# Patient Record
Sex: Female | Born: 1970 | Race: White | Hispanic: No | Marital: Married | Smoking: Never smoker
Health system: Southern US, Community
[De-identification: ages and names within clinical notes are randomized; demographics above are authoritative.]

## PROBLEM LIST (undated history)

## (undated) DIAGNOSIS — E785 Hyperlipidemia, unspecified: Secondary | ICD-10-CM

## (undated) DIAGNOSIS — I10 Essential (primary) hypertension: Secondary | ICD-10-CM

## (undated) DIAGNOSIS — D649 Anemia, unspecified: Secondary | ICD-10-CM

## (undated) DIAGNOSIS — K219 Gastro-esophageal reflux disease without esophagitis: Secondary | ICD-10-CM

## (undated) HISTORY — PX: BREAST SURGERY: SHX581

## (undated) HISTORY — DX: Gastro-esophageal reflux disease without esophagitis: K21.9

## (undated) HISTORY — DX: Essential (primary) hypertension: I10

## (undated) HISTORY — DX: Anemia, unspecified: D64.9

## (undated) HISTORY — DX: Hyperlipidemia, unspecified: E78.5

---

## 2009-12-02 ENCOUNTER — Encounter
Admission: RE | Admit: 2009-12-02 | Discharge: 2009-12-29 | Payer: Self-pay | Source: Home / Self Care | Admitting: Unknown Physician Specialty

## 2013-03-27 ENCOUNTER — Ambulatory Visit (HOSPITAL_COMMUNITY): Payer: Self-pay

## 2017-05-07 DIAGNOSIS — R638 Other symptoms and signs concerning food and fluid intake: Secondary | ICD-10-CM | POA: Diagnosis not present

## 2017-05-07 DIAGNOSIS — K219 Gastro-esophageal reflux disease without esophagitis: Secondary | ICD-10-CM | POA: Diagnosis not present

## 2017-05-07 DIAGNOSIS — R002 Palpitations: Secondary | ICD-10-CM | POA: Diagnosis not present

## 2017-05-08 DIAGNOSIS — D509 Iron deficiency anemia, unspecified: Secondary | ICD-10-CM | POA: Diagnosis not present

## 2017-05-08 DIAGNOSIS — D473 Essential (hemorrhagic) thrombocythemia: Secondary | ICD-10-CM | POA: Diagnosis not present

## 2017-05-08 DIAGNOSIS — R002 Palpitations: Secondary | ICD-10-CM | POA: Diagnosis not present

## 2017-05-08 DIAGNOSIS — Z0000 Encounter for general adult medical examination without abnormal findings: Secondary | ICD-10-CM | POA: Diagnosis not present

## 2017-05-08 DIAGNOSIS — R7309 Other abnormal glucose: Secondary | ICD-10-CM | POA: Diagnosis not present

## 2017-05-08 DIAGNOSIS — Z1389 Encounter for screening for other disorder: Secondary | ICD-10-CM | POA: Diagnosis not present

## 2017-05-16 DIAGNOSIS — D509 Iron deficiency anemia, unspecified: Secondary | ICD-10-CM | POA: Diagnosis not present

## 2017-05-29 DIAGNOSIS — I499 Cardiac arrhythmia, unspecified: Secondary | ICD-10-CM | POA: Insufficient documentation

## 2017-05-29 DIAGNOSIS — R079 Chest pain, unspecified: Secondary | ICD-10-CM | POA: Insufficient documentation

## 2017-05-29 DIAGNOSIS — K219 Gastro-esophageal reflux disease without esophagitis: Secondary | ICD-10-CM | POA: Insufficient documentation

## 2017-05-29 DIAGNOSIS — R42 Dizziness and giddiness: Secondary | ICD-10-CM

## 2017-05-29 DIAGNOSIS — E663 Overweight: Secondary | ICD-10-CM

## 2017-05-30 ENCOUNTER — Encounter: Payer: Self-pay | Admitting: Cardiovascular Disease

## 2017-05-30 ENCOUNTER — Ambulatory Visit (INDEPENDENT_AMBULATORY_CARE_PROVIDER_SITE_OTHER): Payer: 59 | Admitting: Cardiovascular Disease

## 2017-05-30 ENCOUNTER — Other Ambulatory Visit (HOSPITAL_COMMUNITY)
Admission: RE | Admit: 2017-05-30 | Discharge: 2017-05-30 | Disposition: A | Discharge status: Home / Self Care | Payer: 59 | Source: Ambulatory Visit | Attending: Cardiovascular Disease | Admitting: Cardiovascular Disease

## 2017-05-30 VITALS — BP 134/88 | HR 111 | Ht 65.0 in | Wt 154.0 lb

## 2017-05-30 DIAGNOSIS — I10 Essential (primary) hypertension: Secondary | ICD-10-CM

## 2017-05-30 DIAGNOSIS — R002 Palpitations: Secondary | ICD-10-CM

## 2017-05-30 DIAGNOSIS — E7800 Pure hypercholesterolemia, unspecified: Secondary | ICD-10-CM

## 2017-05-30 DIAGNOSIS — M2550 Pain in unspecified joint: Secondary | ICD-10-CM

## 2017-05-30 DIAGNOSIS — R000 Tachycardia, unspecified: Secondary | ICD-10-CM

## 2017-05-30 DIAGNOSIS — D509 Iron deficiency anemia, unspecified: Secondary | ICD-10-CM

## 2017-05-30 LAB — SEDIMENTATION RATE: SED RATE: 20 mm/h (ref 0–22)

## 2017-05-30 MED ORDER — METOPROLOL TARTRATE 25 MG PO TABS: 25.0000 mg | ORAL_TABLET | Freq: Two times a day (BID) | ORAL | 3 refills | Status: DC

## 2017-05-30 NOTE — Progress Notes (Signed)
CARDIOLOGY CONSULT NOTE  Patient ID: Megan Pruitt MRN: [id number] DOB/AGE: 1971/08/11 46 y.o.  Admit date: (Not on file) Primary Physician: [place], [place] Referring Physician: Dr. Hilma Favors  Reason for Consultation: palpitations  HPI: Megan Pruitt is a 46 y.o. female who is being seen today for the evaluation of palpitations at the request of Sharilyn Sites, MD.   Past medical history includes hypertension, hyperlipidemia and iron deficiency anemia.  She has been experiencing palpitations for the past one year but had one episode where she felt jittery. She had blood work done at her PCPs office and was found to have iron deficiency anemia. She has been on ferrous sulfate for the past 2 or 3 weeks and palpitations are slowly improving.  She used to experience shortness of breath when climbing a flight of stairs. This has also been decreasing since she started ferrous sulfate.  She denies chest pain, orthopnea, paroxysmal nocturnal dyspnea, and syncope.  She does not smoke.  She has a history of preeclampsia. She has 5 children.  She had been experiencing diffuse joint pains in her shoulders, hips, and hands. She has gone on a gluten-free diet and these symptoms have improved.  ECG performed in the office today which I ordered and personally interpreted demonstrated sinus tachycardia with an isolated PVC and nonspecific ST segment and T-wave abnormalities seen diffusely. Heart rate is 112 bpm.  Recent labs were reviewed and demonstrated the following: Total cholesterol 282, HDL 53, LDL 200, triglycerides 145, HbA1c 5.4%, albumin 4.6, alkaline phosphatase 130, AST 11, ALT 14, calcium 9.8, white blood cells 8.5, hemoglobin 11.2, platelets 423, MCV 73, sodium 138, potassium 4.3, chloride 101, bicarbonate 25, BUN 15, creatinine 0.93, TSH 2.9.  She said she has had heavy menses ever since her teen years.   Family history: Father died of a myocardial  infarction at age 61. His father also died of an MI at age 25. Her father had hypertension and congestive heart failure. She has a twin sister and 2 brothers all of whom have hypertension.      No Known Allergies  Current Outpatient Prescriptions  Medication Sig Dispense Refill  . atorvastatin (LIPITOR) 80 MG tablet Take 40 mg by mouth daily.     . ferrous sulfate 325 (65 FE) MG tablet Take 325 mg by mouth every other day.    Marland Kitchen NIFEdipine (PROCARDIA-XL/ADALAT CC) 60 MG 24 hr tablet Take 60 mg by mouth daily.     No current facility-administered medications for this visit.     Past Medical History:  Diagnosis Date  . Hypertension     History reviewed. No pertinent surgical history.  Social History   Social History  . Marital status: Married    Spouse name: N/A  . Number of children: N/A  . Years of education: N/A   Occupational History  . Not on file.   Social History Main Topics  . Smoking status: Never Smoker  . Smokeless tobacco: Never Used  . Alcohol use No  . Drug use: No  . Sexual activity: Yes   Other Topics Concern  . Not on file   Social History Narrative  . No narrative on file      Current Meds  Medication Sig  . atorvastatin (LIPITOR) 80 MG tablet Take 40 mg by mouth daily.   . ferrous sulfate 325 (65 FE) MG tablet Take 325 mg by mouth every other day.  Marland Kitchen NIFEdipine (PROCARDIA-XL/ADALAT CC) 60 MG 24  hr tablet Take 60 mg by mouth daily.      Review of systems complete and found to be negative unless listed above in HPI    Physical exam Blood pressure 134/88, pulse (!) 111, height 5' 5"  (1.651 m), weight 154 lb (69.9 kg), SpO2 98 %. General: NAD Neck: No JVD, no thyromegaly or thyroid nodule.  Lungs: Clear to auscultation bilaterally with normal respiratory effort. CV: Nondisplaced PMI. Regular rate and rhythm, normal S1/S2, no S3/S4, no murmur.  No peripheral edema.  No carotid bruit.    Abdomen: Soft, nontender, no distention.  Skin:  Intact without lesions or rashes.  Neurologic: Alert and oriented x 3.  Psych: Normal affect. Extremities: No clubbing or cyanosis.  HEENT: Normal.   ECG: Most recent ECG reviewed.   Labs: No results found for: K, BUN, CREATININE, ALT, TSH, HGB   Lipids: No results found for: LDLCALC, LDLDIRECT, CHOL, TRIG, HDL      ASSESSMENT AND PLAN:  1. Palpitations/tachycardia: These been going on for one year. ECG shows sinus tachycardia with an isolated PVC is noted above. She is only mildly anemic with some symptom improvement with ferrous sulfate. Potassium and TSH are normal. Blood pressure is mildly elevated. I will start metoprolol tartrate 25 mg twice daily. I will order a 2-D echocardiogram with Doppler to evaluate cardiac structure, function, and regional wall motion.  2. Hypertension: Mildly elevated. I will monitor given the addition of metoprolol tartrate (see #1) to see if further antihypertensive titration is indicated.  3. 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 Most recent labs reviewed above. She is now on Lipitor 40 mg. These will need to be repeated within the next few months.  4. Iron deficiency anemia: Hemoglobin 11.2 as noted above. She is now on ferrous sulfate. She has had heavy menses since her teenage years. She is going to see her gynecologist.  5. Diffuse joint pain: I will check an ESR. There has been some symptom improvement with a gluten-free diet.     Disposition: Follow up in 1 month  Signed: Kate Sable, M.D., F.A.C.C.  05/30/2017, 1:13 PM

## 2017-05-30 NOTE — Patient Instructions (Signed)
Medication Instructions:  Start metoprolol 25 mg - twice daily   Labwork: Today esr  Testing/Procedures: Your physician has requested that you have an echocardiogram. Echocardiography is a painless test that uses sound waves to create images of your heart. It provides your doctor with information about the size and shape of your heart and how well your heart's chambers and valves are working. This procedure takes approximately one hour. There are no restrictions for this procedure.    Follow-Up: Your physician recommends that you schedule a follow-up appointment in: 1 month    Any Other Special Instructions Will Be Listed Below (If Applicable).     If you need a refill on your cardiac medications before your next appointment, please call your pharmacy.

## 2017-06-01 ENCOUNTER — Ambulatory Visit (HOSPITAL_COMMUNITY)
Admission: RE | Admit: 2017-06-01 | Discharge: 2017-06-01 | Disposition: A | Discharge status: Home / Self Care | Payer: 59 | Source: Ambulatory Visit | Attending: Cardiovascular Disease | Admitting: Cardiovascular Disease

## 2017-06-01 DIAGNOSIS — R000 Tachycardia, unspecified: Secondary | ICD-10-CM | POA: Insufficient documentation

## 2017-06-01 DIAGNOSIS — R002 Palpitations: Secondary | ICD-10-CM | POA: Insufficient documentation

## 2017-06-01 LAB — ECHOCARDIOGRAM COMPLETE
AVLVOTPG: 4 mmHg
CHL CUP MV DEC (S): 211
CHL CUP STROKE VOLUME: 52 mL
E/e' ratio: 4.4
EWDT: 211 ms
FS: 34 % (ref 28–44)
IV/PV OW: 1.01
LA ID, A-P, ES: 32 mm
LA diam end sys: 32 mm
LA diam index: 1.78 cm/m2
LA vol A4C: 28.9 ml
LA vol index: 22.4 mL/m2
LA vol: 40.4 mL
LV E/e' medial: 4.4
LV E/e'average: 4.4
LV TDI E'LATERAL: 16.6
LV TDI E'MEDIAL: 10.7
LV dias vol: 81 mL (ref 46–106)
LV e' LATERAL: 16.6 cm/s
LV sys vol index: 16 mL/m2
LV sys vol: 29 mL
LVDIAVOLIN: 45 mL/m2
LVOT VTI: 25.1 cm
LVOT area: 2.27 cm2
LVOT diameter: 17 mm
LVOTPV: 105 cm/s
LVOTSV: 57 mL
MV Peak grad: 2 mmHg
MV pk A vel: 73.7 m/s
MV pk E vel: 73 m/s
PW: 8.74 mm — AB (ref 0.6–1.1)
RV LATERAL S' VELOCITY: 12.4 cm/s
RV TAPSE: 23.6 mm
RV sys press: 17 mmHg
Reg peak vel: 186 cm/s
Simpson's disk: 64
TR max vel: 186 cm/s

## 2017-06-01 NOTE — Progress Notes (Signed)
*  PRELIMINARY RESULTS* Echocardiogram 2D Echocardiogram has been performed.  Megan Pruitt 06/01/2017, 11:08 AM

## 2017-06-04 ENCOUNTER — Telehealth: Payer: Self-pay | Admitting: Cardiovascular Disease

## 2017-06-04 MED ORDER — METOPROLOL TARTRATE 25 MG PO TABS: 12.5000 mg | ORAL_TABLET | Freq: Two times a day (BID) | ORAL | 3 refills | Status: DC

## 2017-06-04 NOTE — Telephone Encounter (Signed)
Pt has some questions about her metoprolol tartrate (LOPRESSOR) 25 MG tablet [[id number]]  Please give her a call @ [phone]

## 2017-06-04 NOTE — Telephone Encounter (Signed)
Pt states lopressor  She started 5 days ago,makes her tired and her fit at night says HR in 40's, is normal during day.

## 2017-06-04 NOTE — Telephone Encounter (Signed)
Reduce to 12.5mg bid. 

## 2017-06-04 NOTE — Telephone Encounter (Signed)
LM on pt's secure line to reduce dose, call back and let us know how she feels

## 2017-07-03 DIAGNOSIS — E7800 Pure hypercholesterolemia, unspecified: Secondary | ICD-10-CM | POA: Diagnosis not present

## 2017-07-03 DIAGNOSIS — D509 Iron deficiency anemia, unspecified: Secondary | ICD-10-CM | POA: Diagnosis not present

## 2017-07-05 ENCOUNTER — Ambulatory Visit (INDEPENDENT_AMBULATORY_CARE_PROVIDER_SITE_OTHER): Payer: 59 | Admitting: Cardiovascular Disease

## 2017-07-05 ENCOUNTER — Encounter: Payer: Self-pay | Admitting: Cardiovascular Disease

## 2017-07-05 VITALS — BP 106/68 | HR 78 | Ht 65.0 in | Wt 155.6 lb

## 2017-07-05 DIAGNOSIS — I10 Essential (primary) hypertension: Secondary | ICD-10-CM

## 2017-07-05 DIAGNOSIS — M2550 Pain in unspecified joint: Secondary | ICD-10-CM | POA: Diagnosis not present

## 2017-07-05 DIAGNOSIS — R002 Palpitations: Secondary | ICD-10-CM

## 2017-07-05 DIAGNOSIS — E7800 Pure hypercholesterolemia, unspecified: Secondary | ICD-10-CM

## 2017-07-05 DIAGNOSIS — R000 Tachycardia, unspecified: Secondary | ICD-10-CM

## 2017-07-05 DIAGNOSIS — D509 Iron deficiency anemia, unspecified: Secondary | ICD-10-CM

## 2017-07-05 NOTE — Progress Notes (Signed)
SUBJECTIVE: The patient presents for follow-up of palpitations.  ESR was normal.  Echocardiogram was entirely normal, LVEF 60-65%.  She is feeling much better.  Palpitations are far less frequent.  I reviewed labs performed on 07/03/17: Total cholesterol 139, HDL 51, LDL 68, triglycerides 100, white blood cell 7, hemoglobin 13, platelets 339, MCV 78.    She has a history of preeclampsia. She has 5 children.   Family history: Father died of a myocardial infarction at age 38. His father also died of an MI at age 67. Her father had hypertension and congestive heart failure. She has a twin sister and 2 brothers all of whom have hypertension.   Review of Systems: As per "subjective", otherwise negative.  No Known Allergies  Current Outpatient Medications  Medication Sig Dispense Refill  . atorvastatin (LIPITOR) 40 MG tablet Take 40 mg daily by mouth.    . ferrous sulfate 325 (65 FE) MG tablet Take 325 mg by mouth every other day.    . metoprolol tartrate (LOPRESSOR) 25 MG tablet Take 0.5 tablets (12.5 mg total) by mouth 2 (two) times daily. 90 tablet 3  . NIFEdipine (PROCARDIA-XL/ADALAT CC) 60 MG 24 hr tablet Take 60 mg by mouth daily.     No current facility-administered medications for this visit.     Past Medical History:  Diagnosis Date  . Hypertension     Past Surgical History:  Procedure Laterality Date  . BREAST SURGERY     absess     Social History   Socioeconomic History  . Marital status: Married    Spouse name: Not on file  . Number of children: Not on file  . Years of education: Not on file  . Highest education level: Not on file  Social Needs  . Financial resource strain: Not on file  . Food insecurity - worry: Not on file  . Food insecurity - inability: Not on file  . Transportation needs - medical: Not on file  . Transportation needs - non-medical: Not on file  Occupational History  . Not on file  Tobacco Use  . Smoking status: Never  Smoker  . Smokeless tobacco: Never Used  Substance and Sexual Activity  . Alcohol use: No  . Drug use: No  . Sexual activity: Yes  Other Topics Concern  . Not on file  Social History Narrative  . Not on file     Vitals:   07/05/17 0958  BP: 106/68  Pulse: 78  SpO2: 98%  Weight: 155 lb 9.6 oz (70.6 kg)  Height: 5' 5"  (1.651 m)    Wt Readings from Last 3 Encounters:  07/05/17 155 lb 9.6 oz (70.6 kg)  05/30/17 154 lb (69.9 kg)     PHYSICAL EXAM General: NAD HEENT: Normal. Neck: No JVD, no thyromegaly. Lungs: Clear to auscultation bilaterally with normal respiratory effort. CV: Regular rate and rhythm, normal S1/S2, no S3/S4, no murmur. No pretibial or periankle edema.  No carotid bruit.   Abdomen: Soft, nontender, no distention.  Neurologic: Alert and oriented.  Psych: Normal affect. Skin: Normal. Musculoskeletal: No gross deformities.    ECG: Most recent ECG reviewed.   Labs: No results found for: K, BUN, CREATININE, ALT, TSH, HGB   Lipids: No results found for: LDLCALC, LDLDIRECT, CHOL, TRIG, HDL     ASSESSMENT AND PLAN:  1. Palpitations/tachycardia:  Symptomatically improved on metoprolol tartrate 12.5 mg twice daily.  These been going on for one year. ECG showed sinus tachycardia  with an isolated PVC. She was only mildly anemic with symptom improvement with ferrous sulfate. Potassium and TSH are normal. Blood pressure is now normal. Echocardiogram was entirely normal as detailed above.  2. Hypertension: Blood pressure now controlled with the addition of low-dose metoprolol tartrate.  She is also on nifedipine 60 mg daily.  No changes to therapy.  3. Hyperlipidemia: Lipids well controlled on Lipitor 40 milligrams daily as detailed above.  No changes.  4. Iron deficiency anemia: Hemoglobin previously 11.2 and is now 13.  Continue ferrous sulfate.  5. Diffuse joint pain: ESR was normal. There has been some symptom improvement with a gluten-free  diet.     Disposition: Follow up 6 months   Kate Sable, M.D., F.A.C.C.

## 2017-07-05 NOTE — Patient Instructions (Signed)
Your physician wants you to follow-up in: 6 months with Dr.Koneswaran You will receive a reminder letter in the mail two months in advance. If you don't receive a letter, please call our office to schedule the follow-up appointment.   Your physician recommends that you continue on your current medications as directed. Please refer to the Current Medication list given to you today.    If you need a refill on your cardiac medications before your next appointment, please call your pharmacy.     No lab work or tests ordered today.     Thank you for choosing  [facility] !         

## 2018-01-17 ENCOUNTER — Ambulatory Visit: Payer: 59 | Admitting: Cardiovascular Disease

## 2018-03-07 ENCOUNTER — Encounter: Payer: Self-pay | Admitting: Cardiovascular Disease

## 2018-03-07 ENCOUNTER — Ambulatory Visit (INDEPENDENT_AMBULATORY_CARE_PROVIDER_SITE_OTHER): Payer: 59 | Admitting: Cardiovascular Disease

## 2018-03-07 ENCOUNTER — Other Ambulatory Visit (HOSPITAL_COMMUNITY)
Admission: RE | Admit: 2018-03-07 | Discharge: 2018-03-07 | Disposition: A | Discharge status: Home / Self Care | Payer: 59 | Source: Ambulatory Visit | Attending: Cardiovascular Disease | Admitting: Cardiovascular Disease

## 2018-03-07 ENCOUNTER — Other Ambulatory Visit: Payer: Self-pay | Admitting: Cardiovascular Disease

## 2018-03-07 ENCOUNTER — Ambulatory Visit (HOSPITAL_COMMUNITY)
Admission: RE | Admit: 2018-03-07 | Discharge: 2018-03-07 | Disposition: A | Discharge status: Home / Self Care | Payer: 59 | Source: Ambulatory Visit | Attending: Cardiovascular Disease | Admitting: Cardiovascular Disease

## 2018-03-07 VITALS — BP 118/68 | HR 78 | Ht 65.0 in | Wt 155.0 lb

## 2018-03-07 DIAGNOSIS — E7800 Pure hypercholesterolemia, unspecified: Secondary | ICD-10-CM

## 2018-03-07 DIAGNOSIS — I10 Essential (primary) hypertension: Secondary | ICD-10-CM

## 2018-03-07 DIAGNOSIS — A669 Yaws, unspecified: Secondary | ICD-10-CM

## 2018-03-07 DIAGNOSIS — M1711 Unilateral primary osteoarthritis, right knee: Secondary | ICD-10-CM | POA: Diagnosis not present

## 2018-03-07 DIAGNOSIS — R000 Tachycardia, unspecified: Secondary | ICD-10-CM

## 2018-03-07 DIAGNOSIS — D509 Iron deficiency anemia, unspecified: Secondary | ICD-10-CM

## 2018-03-07 DIAGNOSIS — M2550 Pain in unspecified joint: Secondary | ICD-10-CM

## 2018-03-07 DIAGNOSIS — M25461 Effusion, right knee: Secondary | ICD-10-CM | POA: Diagnosis not present

## 2018-03-07 DIAGNOSIS — R609 Edema, unspecified: Secondary | ICD-10-CM

## 2018-03-07 DIAGNOSIS — M25561 Pain in right knee: Secondary | ICD-10-CM

## 2018-03-07 DIAGNOSIS — R002 Palpitations: Secondary | ICD-10-CM | POA: Diagnosis not present

## 2018-03-07 NOTE — Patient Instructions (Signed)
Medication Instructions:  Your physician recommends that you continue on your current medications as directed. Please refer to the Current Medication list given to you today.   Labwork: RHEUMATOID FACTOR ANTI-CCP   Testing/Procedures: KNEE X-RAY   Follow-Up: Your physician wants you to follow-up in: 6 MONTHS .  You will receive a reminder letter in the mail two months in advance. If you don't receive a letter, please call our office to schedule the follow-up appointment.   Any Other Special Instructions Will Be Listed Below (If Applicable).     If you need a refill on your cardiac medications before your next appointment, please call your pharmacy.

## 2018-03-07 NOTE — Addendum Note (Signed)
Addended by: Debbora Lacrosse R on: 03/07/2018 01:38 PM   Modules accepted: Orders

## 2018-03-07 NOTE — Progress Notes (Signed)
SUBJECTIVE: The patient presents for follow-up of palpitations.  She underwent a normal echocardiogram in October 2018, LVEF 60 to 65%.  She has a history of preeclampsia.  She has 5 children.  She denies palpitations.  She has had diffuse joint pains which have apparently flared in the past month.  She did initially try to gluten-free diet and symptoms had resolved for quite some time.  She has particular difficulty with her right knee regarding pain and stiffness.  By the end of the day she is unable to raise her right leg up completely.  She has noticed right knee swelling.  She denies chest pain, shortness of breath, and leg swelling.      Review of Systems: As per "subjective", otherwise negative.  No Known Allergies  Current Outpatient Medications  Medication Sig Dispense Refill  . atorvastatin (LIPITOR) 40 MG tablet Take 40 mg daily by mouth.    . ferrous sulfate 325 (65 FE) MG tablet Take 325 mg by mouth every other day.    . metoprolol tartrate (LOPRESSOR) 25 MG tablet Take 0.5 tablets (12.5 mg total) by mouth 2 (two) times daily. 90 tablet 3  . NIFEdipine (PROCARDIA-XL/ADALAT CC) 60 MG 24 hr tablet Take 60 mg by mouth daily.     No current facility-administered medications for this visit.     Past Medical History:  Diagnosis Date  . Hypertension     Past Surgical History:  Procedure Laterality Date  . BREAST SURGERY     absess     Social History   Socioeconomic History  . Marital status: Married    Spouse name: Not on file  . Number of children: Not on file  . Years of education: Not on file  . Highest education level: Not on file  Occupational History  . Not on file  Social Needs  . Financial resource strain: Not on file  . Food insecurity:    Worry: Not on file    Inability: Not on file  . Transportation needs:    Medical: Not on file    Non-medical: Not on file  Tobacco Use  . Smoking status: Never Smoker  . Smokeless tobacco: Never Used    Substance and Sexual Activity  . Alcohol use: No  . Drug use: No  . Sexual activity: Yes  Lifestyle  . Physical activity:    Days per week: Not on file    Minutes per session: Not on file  . Stress: Not on file  Relationships  . Social connections:    Talks on phone: Not on file    Gets together: Not on file    Attends religious service: Not on file    Active member of club or organization: Not on file    Attends meetings of clubs or organizations: Not on file    Relationship status: Not on file  . Intimate partner violence:    Fear of current or ex partner: Not on file    Emotionally abused: Not on file    Physically abused: Not on file    Forced sexual activity: Not on file  Other Topics Concern  . Not on file  Social History Narrative  . Not on file     Vitals:   03/07/18 1314  BP: 118/68  Pulse: 78  SpO2: 97%  Weight: 155 lb (70.3 kg)  Height: 5\' 5"  (1.651 m)    Wt Readings from Last 3 Encounters:  03/07/18 155 lb (70.3  kg)  07/05/17 155 lb 9.6 oz (70.6 kg)  05/30/17 154 lb (69.9 kg)     PHYSICAL EXAM General: NAD HEENT: Normal. Neck: No JVD, no thyromegaly. Lungs: Clear to auscultation bilaterally with normal respiratory effort. CV: Regular rate and rhythm, normal S1/S2, no S3/S4, no murmur. No pretibial or periankle edema.  No carotid bruit.   Abdomen: Soft, nontender, no distention.  Neurologic: Alert and oriented.  Psych: Normal affect. Skin: Normal. Musculoskeletal: Mild right knee swelling.    ECG: Reviewed above under Subjective   Labs: No results found for: K, BUN, CREATININE, ALT, TSH, HGB   Lipids: No results found for: LDLCALC, LDLDIRECT, CHOL, TRIG, HDL     ASSESSMENT AND PLAN: 1.Palpitations/tachycardia: Symptomatically stable metoprolol tartrate 12.5 mg twice daily. Echocardiogram was entirely normal as detailed above.  2. Hypertension: Blood pressure is normal on low-dose metoprolol tartrate and nifedipine 60 mg daily.   No changes to therapy.  3. Hyperlipidemia: Lipids well controlled on Lipitor 40 milligrams.  No changes.  4. Iron deficiency anemia: Continue ferrous sulfate.  5.  Diffuse joint pains and right knee pain: I will obtain an x-ray to evaluate for any joint erosion.  I will also obtain rheumatoid factor and anti-CCP antibodies.    Disposition: Follow up 6 months   Kate Sable, M.D., F.A.C.C.

## 2018-03-08 LAB — CYCLIC CITRUL PEPTIDE ANTIBODY, IGG/IGA: CCP ANTIBODIES IGG/IGA: 2 U (ref 0–19)

## 2018-03-08 LAB — RHEUMATOID FACTOR

## 2018-03-25 DIAGNOSIS — E663 Overweight: Secondary | ICD-10-CM | POA: Diagnosis not present

## 2018-03-25 DIAGNOSIS — Z0000 Encounter for general adult medical examination without abnormal findings: Secondary | ICD-10-CM | POA: Diagnosis not present

## 2018-03-25 DIAGNOSIS — Z6825 Body mass index (BMI) 25.0-25.9, adult: Secondary | ICD-10-CM | POA: Diagnosis not present

## 2018-04-03 ENCOUNTER — Other Ambulatory Visit (HOSPITAL_COMMUNITY): Payer: Self-pay | Admitting: Family Medicine

## 2018-04-03 DIAGNOSIS — Z1231 Encounter for screening mammogram for malignant neoplasm of breast: Secondary | ICD-10-CM

## 2018-06-12 ENCOUNTER — Other Ambulatory Visit: Payer: Self-pay | Admitting: Cardiovascular Disease

## 2018-09-18 ENCOUNTER — Telehealth: Payer: Self-pay | Admitting: Cardiovascular Disease

## 2018-09-18 NOTE — Telephone Encounter (Signed)
Pt is needing refills sent in on her BP medication, tying to find a f/u apt for the pt. She's a Pharmacist, hospital.

## 2018-09-19 MED ORDER — METOPROLOL TARTRATE 25 MG PO TABS: 12.5000 mg | ORAL_TABLET | Freq: Two times a day (BID) | ORAL | 3 refills | Status: DC

## 2018-09-19 NOTE — Telephone Encounter (Signed)
Refill sent to [facility].

## 2018-10-14 DIAGNOSIS — Z0000 Encounter for general adult medical examination without abnormal findings: Secondary | ICD-10-CM | POA: Diagnosis not present

## 2018-10-14 DIAGNOSIS — Z6823 Body mass index (BMI) 23.0-23.9, adult: Secondary | ICD-10-CM | POA: Diagnosis not present

## 2018-10-14 DIAGNOSIS — E7849 Other hyperlipidemia: Secondary | ICD-10-CM | POA: Diagnosis not present

## 2018-10-14 DIAGNOSIS — Z1389 Encounter for screening for other disorder: Secondary | ICD-10-CM | POA: Diagnosis not present

## 2018-10-14 DIAGNOSIS — I10 Essential (primary) hypertension: Secondary | ICD-10-CM | POA: Diagnosis not present

## 2018-10-28 DIAGNOSIS — D696 Thrombocytopenia, unspecified: Secondary | ICD-10-CM | POA: Diagnosis not present

## 2018-11-01 ENCOUNTER — Ambulatory Visit (INDEPENDENT_AMBULATORY_CARE_PROVIDER_SITE_OTHER): Payer: 59 | Admitting: Cardiovascular Disease

## 2018-11-01 ENCOUNTER — Encounter: Payer: Self-pay | Admitting: Cardiovascular Disease

## 2018-11-01 VITALS — BP 123/81 | HR 67 | Ht 65.0 in | Wt 143.2 lb

## 2018-11-01 DIAGNOSIS — E7800 Pure hypercholesterolemia, unspecified: Secondary | ICD-10-CM | POA: Diagnosis not present

## 2018-11-01 DIAGNOSIS — I10 Essential (primary) hypertension: Secondary | ICD-10-CM

## 2018-11-01 DIAGNOSIS — R000 Tachycardia, unspecified: Secondary | ICD-10-CM | POA: Diagnosis not present

## 2018-11-01 DIAGNOSIS — D509 Iron deficiency anemia, unspecified: Secondary | ICD-10-CM

## 2018-11-01 DIAGNOSIS — R002 Palpitations: Secondary | ICD-10-CM

## 2018-11-01 MED ORDER — NIFEDIPINE ER 30 MG PO TB24: 30.0000 mg | ORAL_TABLET | Freq: Every day | ORAL | 3 refills | Status: DC

## 2018-11-01 NOTE — Progress Notes (Signed)
SUBJECTIVE: The patient presents for follow-up of palpitations.  She underwent a normal echocardiogram in October 2018, LVEF 60 to 65%.  She has a history of preeclampsia.  She has 5 children.  She had diffuse joint pains at her last visit with considerable knee pain.  I checked rheumatoid factor and anti-CCP antibodies and both were normal.  X-ray of the right knee showed an effusion with mild degenerative changes.  ECG performed today showed sinus rhythm with nonspecific T wave abnormalities.  The patient denies any symptoms of chest pain, palpitations, shortness of breath, lightheadedness, dizziness, leg swelling, orthopnea, PND, and syncope.  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  She currently has a child in kindergarten and in the third grade at the same school.  She went to a gluten-free diet and has lost 12 pounds by our scales.  She did not take her nifedipine last night and blood pressure is normal today.     Review of Systems: As per "subjective", otherwise negative.  No Known Allergies  Current Outpatient Medications  Medication Sig Dispense Refill  . atorvastatin (LIPITOR) 20 MG tablet Take 20 mg by mouth daily.    . ferrous sulfate 325 (65 FE) MG tablet Take 325 mg by mouth every other day.    . metoprolol tartrate (LOPRESSOR) 25 MG tablet Take 0.5 tablets (12.5 mg total) by mouth 2 (two) times daily. 90 tablet 3  . NIFEdipine (PROCARDIA-XL/ADALAT CC) 60 MG 24 hr tablet Take 60 mg by mouth daily.     No current facility-administered medications for this visit.     Past Medical History:  Diagnosis Date  . Hypertension     Past Surgical History:  Procedure Laterality Date  . BREAST SURGERY     absess     Social History   Socioeconomic History  . Marital status: Married    Spouse name: Not on file  . Number of children:  Not on file  . Years of education: Not on file  . Highest education level: Not on file  Occupational History  . Not on file  Social Needs  . Financial resource strain: Not on file  . Food insecurity:    Worry: Not on file    Inability: Not on file  . Transportation needs:    Medical: Not on file    Non-medical: Not on file  Tobacco Use  . Smoking status: Never Smoker  . Smokeless tobacco: Never Used  Substance and Sexual Activity  . Alcohol use: No  . Drug use: No  . Sexual activity: Yes  Lifestyle  . Physical activity:    Days per week: Not on file    Minutes per session: Not on file  . Stress: Not on file  Relationships  . Social connections:    Talks on phone: Not on file    Gets together: Not on file    Attends religious service: Not on file    Active member of club or organization: Not on file    Attends meetings of clubs or organizations: Not on file    Relationship status: Not on file  . Intimate partner violence:    Fear of current or ex partner: Not on file    Emotionally abused: Not on file    Physically abused: Not on file  Forced sexual activity: Not on file  Other Topics Concern  . Not on file  Social History Narrative  . Not on file     Vitals:   11/01/18 1600  BP: 123/81  Pulse: 67  SpO2: 100%  Weight: 143 lb 3.2 oz (65 kg)  Height: 5\' 5"  (1.651 m)    Wt Readings from Last 3 Encounters:  11/01/18 143 lb 3.2 oz (65 kg)  03/07/18 155 lb (70.3 kg)  07/05/17 155 lb 9.6 oz (70.6 kg)     PHYSICAL EXAM General: NAD HEENT: Normal. Neck: No JVD, no thyromegaly. Lungs: Clear to auscultation bilaterally with normal respiratory effort. CV: Regular rate and rhythm, normal S1/S2, no S3/S4, no murmur. No pretibial or periankle edema.  No carotid bruit.   Abdomen: Soft, nontender, no distention.  Neurologic: Alert and oriented.  Psych: Normal affect. Skin: Normal. Musculoskeletal: No gross deformities.    ECG: Reviewed above under  Subjective   Labs: No results found for: K, BUN, CREATININE, ALT, TSH, HGB   Lipids: No results found for: LDLCALC, LDLDIRECT, CHOL, TRIG, HDL     ASSESSMENT AND PLAN:  1.Palpitations/tachycardia:Symptomatically stable metoprolol tartrate 12.5 mg twice daily. Echocardiogram was entirely normal as detailed above.  2. Hypertension:Blood pressure is normal.  She did not take her nifedipine last night.  I will reduce the dose to 30 mg.  I asked her to keep monitoring her blood pressure to see if it remains controlled.  3. Hyperlipidemia:Continue statin therapy.  4. Iron deficiency anemia: Continue ferrous sulfate.   Disposition: Follow up 1 year   Kate Sable, M.D., F.A.C.C.

## 2018-11-01 NOTE — Patient Instructions (Signed)
Medication Instructions:   Decrease Nifedipine to 30mg  daily.   Continue all other medications.     Follow-Up: Your physician wants you to follow up in:  1 year.  You will receive a reminder letter in the mail one-two months in advance.  If you don't receive a letter, please call our office to schedule the follow up appointment   Any Other Special Instructions Will Be Listed Below (If Applicable).  If you need a refill on your cardiac medications before your next appointment, please call your pharmacy.

## 2019-10-26 ENCOUNTER — Ambulatory Visit: Payer: 59 | Attending: Internal Medicine

## 2019-11-04 ENCOUNTER — Other Ambulatory Visit: Payer: Self-pay | Admitting: Cardiovascular Disease

## 2019-11-04 MED ORDER — NIFEDIPINE ER 30 MG PO TB24: 30.0000 mg | ORAL_TABLET | Freq: Every day | ORAL | 0 refills | Status: DC

## 2019-11-04 NOTE — Telephone Encounter (Signed)
Done

## 2019-11-04 NOTE — Telephone Encounter (Signed)
° ° ° °  1. Which medications need to be refilled? (please list name of each medication and dose if known) NIFEdipine (ADALAT CC) 30 MG    2. Which pharmacy/location (including street and city if local pharmacy) is medication to be sent to? [facility] [place]   3. Do they need a 30 day or 90 day supply?   [place]

## 2019-12-08 ENCOUNTER — Other Ambulatory Visit: Payer: Self-pay

## 2019-12-08 ENCOUNTER — Encounter: Payer: Self-pay | Admitting: Family Medicine

## 2019-12-08 ENCOUNTER — Ambulatory Visit (INDEPENDENT_AMBULATORY_CARE_PROVIDER_SITE_OTHER): Payer: BC Managed Care – PPO | Admitting: Family Medicine

## 2019-12-08 VITALS — BP 122/80 | HR 70 | Ht 65.0 in | Wt 165.8 lb

## 2019-12-08 DIAGNOSIS — I10 Essential (primary) hypertension: Secondary | ICD-10-CM

## 2019-12-08 DIAGNOSIS — E782 Mixed hyperlipidemia: Secondary | ICD-10-CM | POA: Diagnosis not present

## 2019-12-08 DIAGNOSIS — D509 Iron deficiency anemia, unspecified: Secondary | ICD-10-CM | POA: Diagnosis not present

## 2019-12-08 DIAGNOSIS — R002 Palpitations: Secondary | ICD-10-CM | POA: Diagnosis not present

## 2019-12-08 NOTE — Progress Notes (Addendum)
Cardiology Office Note  Date: 12/08/2019   ID: Megan Pruitt, DOB June 09, 1971, MRN [id number]  PCP:  Sharilyn Sites, MD  Cardiologist:  Kate Sable, MD Electrophysiologist:  None   Chief Complaint: Follow-up: Palpitations, HTN, HLD  History of Present Illness: Megan Pruitt is a 49 y.o. female with a history of palpitations, HTN, HLD, IDA  Last visit was with Dr. Bronson Ing 11/01/2018.  At that visit she had no complaints of chest pain, palpitations, shortness of breath, lightheadedness, dizziness, leg swelling, orthopnea or PND.  Here for 1 year follow-up.  She denies any recent acute illnesses, hospitalizations, surgeries, or travels.  Denies any Covid-like symptoms.  No complaints of progressive angina or dyspnea, palpitations or arrhythmias, orthostatic symptoms, stroke or TIA-like symptoms, blood in stool or urine.  No claudication-like symptoms, or lower extremity edema.    Past Medical History:  Diagnosis Date  . Hypertension     Past Surgical History:  Procedure Laterality Date  . BREAST SURGERY     absess     Current Outpatient Medications  Medication Sig Dispense Refill  . atorvastatin (LIPITOR) 20 MG tablet Take 20 mg by mouth daily.    . ferrous sulfate 325 (65 FE) MG tablet Take 325 mg by mouth every other day.    . metoprolol tartrate (LOPRESSOR) 25 MG tablet Take 0.5 tablets (12.5 mg total) by mouth 2 (two) times daily. 90 tablet 3  . NIFEdipine (ADALAT CC) 30 MG 24 hr tablet Take 1 tablet (30 mg total) by mouth daily. 90 tablet 0   No current facility-administered medications for this visit.   Allergies:  Patient has no known allergies.   Social History: The patient  reports that she has never smoked. She has never used smokeless tobacco. She reports that she does not drink alcohol or use drugs.   Family History: The patient's family history includes Heart attack in her father; Hypertension in her brother, mother, and sister.   ROS:  Please see  the history of present illness. Otherwise, complete review of systems is positive for none.  All other systems are reviewed and negative.   Physical Exam: VS:  There were no vitals taken for this visit., BMI There is no height or weight on file to calculate BMI.  Wt Readings from Last 3 Encounters:  11/01/18 143 lb 3.2 oz (65 kg)  03/07/18 155 lb (70.3 kg)  07/05/17 155 lb 9.6 oz (70.6 kg)    General: Patient appears comfortable at rest. Neck: Supple, no elevated JVP or carotid bruits, no thyromegaly. Lungs: Clear to auscultation, nonlabored breathing at rest. Cardiac: Regular rate and rhythm, no S3 or significant systolic murmur, no pericardial rub. Extremities: No pitting edema, distal pulses 2+. Skin: Warm and dry. Musculoskeletal: No kyphosis. Neuropsychiatric: Alert and oriented x3, affect grossly appropriate.  ECG:  An ECG dated 12/08/2019 was personally reviewed today and demonstrated:  Normal sinus rhythm rate of 69.  Recent Labwork: No results found for requested labs within last 8760 hours.  No results found for: CHOL, TRIG, HDL, CHOLHDL, VLDL, LDLCALC, LDLDIRECT  Other Studies Reviewed Today:  Echocardiogram 06/01/2017 Study Conclusions   - Left ventricle: The cavity size was normal. Wall thickness was  normal. Systolic function was normal. The estimated ejection  fraction was in the range of 60% to 65%. Wall motion was normal;  there were no regional wall motion abnormalities. Left  ventricular diastolic function parameters were normal.   Impressions:   - Normal study.   Assessment and  Plan:  1. Palpitations   2. Essential hypertension   3. Mixed hyperlipidemia   4. Iron deficiency anemia, unspecified iron deficiency anemia type    1. Palpitations Denies any sensation of palpitations or arrhythmias in the interval since last visit.  Continue Metoprolol 12.5 mg p.o. twice daily and nifedipine 30 mg daily.  2. Essential hypertension Blood pressure  is well controlled on current therapy.  Blood pressure today 122/80.  Continue metoprolol 12.5 mg p.o. twice daily.  3. Mixed hyperlipidemia Patient states she had lab work with PCP in the interval since last visit and states everything was normal.  4. Iron deficiency anemia, unspecified iron deficiency anemia type PCP is managing anemia with ferrous sulfate 325 mg daily.  Follow-up with PCP for management.  Medication Adjustments/Labs and Tests Ordered: Current medicines are reviewed at length with the patient today.  Concerns regarding medicines are outlined above.   Disposition: Follow-up with Dr Bronson Ing or APP 1 year  Signed, Levell July, NP 12/08/2019 9:43 AM    [facility] [place], [place], [place] [address] Phone: [phone]; Fax: [phone]

## 2019-12-08 NOTE — Patient Instructions (Signed)

## 2019-12-25 ENCOUNTER — Other Ambulatory Visit: Payer: Self-pay | Admitting: Cardiovascular Disease

## 2019-12-25 MED ORDER — METOPROLOL TARTRATE 25 MG PO TABS: 12.5000 mg | ORAL_TABLET | Freq: Two times a day (BID) | ORAL | 3 refills | Status: DC

## 2019-12-25 NOTE — Telephone Encounter (Signed)
     1. Which medications need to be refilled? (please list name of each medication and dose if known)  Metoprolol   2. Which pharmacy/location (including street and city if local pharmacy) is medication to be sent to?  [facility]  [address]   3. Do they need a 30 day or 90 day supply?   Patient requested to switch her Pharmacy to [place], [place]

## 2020-02-01 ENCOUNTER — Other Ambulatory Visit: Payer: Self-pay | Admitting: Cardiovascular Disease

## 2020-02-11 ENCOUNTER — Telehealth: Payer: Self-pay | Admitting: Cardiovascular Disease

## 2020-02-11 MED ORDER — NIFEDIPINE ER 30 MG PO TB24: 30.0000 mg | ORAL_TABLET | Freq: Every day | ORAL | 2 refills | Status: DC

## 2020-02-11 NOTE — Telephone Encounter (Signed)
Pt is needed a Rx for her NIFEdipine (ADALAT CC) 30 MG 24 hr tablet [[id number]]  [facility] [place]

## 2020-03-16 DIAGNOSIS — Z862 Personal history of diseases of the blood and blood-forming organs and certain disorders involving the immune mechanism: Secondary | ICD-10-CM | POA: Diagnosis not present

## 2020-03-16 DIAGNOSIS — Z1389 Encounter for screening for other disorder: Secondary | ICD-10-CM | POA: Diagnosis not present

## 2020-03-16 DIAGNOSIS — N959 Unspecified menopausal and perimenopausal disorder: Secondary | ICD-10-CM | POA: Diagnosis not present

## 2020-03-16 DIAGNOSIS — R7309 Other abnormal glucose: Secondary | ICD-10-CM | POA: Diagnosis not present

## 2020-03-16 DIAGNOSIS — Z0000 Encounter for general adult medical examination without abnormal findings: Secondary | ICD-10-CM | POA: Diagnosis not present

## 2020-03-16 DIAGNOSIS — Z6828 Body mass index (BMI) 28.0-28.9, adult: Secondary | ICD-10-CM | POA: Diagnosis not present

## 2020-03-16 DIAGNOSIS — E7849 Other hyperlipidemia: Secondary | ICD-10-CM | POA: Diagnosis not present

## 2020-03-16 DIAGNOSIS — R002 Palpitations: Secondary | ICD-10-CM | POA: Diagnosis not present

## 2020-03-16 DIAGNOSIS — I10 Essential (primary) hypertension: Secondary | ICD-10-CM | POA: Diagnosis not present

## 2020-03-16 DIAGNOSIS — E538 Deficiency of other specified B group vitamins: Secondary | ICD-10-CM | POA: Diagnosis not present

## 2020-03-17 ENCOUNTER — Other Ambulatory Visit (HOSPITAL_COMMUNITY): Payer: Self-pay | Admitting: Family Medicine

## 2020-03-18 ENCOUNTER — Other Ambulatory Visit (HOSPITAL_COMMUNITY): Payer: Self-pay | Admitting: Family Medicine

## 2020-03-18 DIAGNOSIS — N6011 Diffuse cystic mastopathy of right breast: Secondary | ICD-10-CM

## 2020-04-06 ENCOUNTER — Ambulatory Visit (HOSPITAL_COMMUNITY)
Admission: RE | Admit: 2020-04-06 | Discharge: 2020-04-06 | Disposition: A | Discharge status: Home / Self Care | Payer: BC Managed Care – PPO | Source: Ambulatory Visit | Attending: Family Medicine | Admitting: Family Medicine

## 2020-04-06 ENCOUNTER — Other Ambulatory Visit: Payer: Self-pay

## 2020-04-06 ENCOUNTER — Encounter (HOSPITAL_COMMUNITY): Payer: Self-pay

## 2020-04-06 DIAGNOSIS — N6011 Diffuse cystic mastopathy of right breast: Secondary | ICD-10-CM | POA: Diagnosis not present

## 2020-04-06 DIAGNOSIS — R921 Mammographic calcification found on diagnostic imaging of breast: Secondary | ICD-10-CM | POA: Diagnosis not present

## 2020-04-06 DIAGNOSIS — N6489 Other specified disorders of breast: Secondary | ICD-10-CM | POA: Diagnosis not present

## 2020-04-14 ENCOUNTER — Other Ambulatory Visit: Payer: Self-pay | Admitting: Family Medicine

## 2020-04-14 DIAGNOSIS — R921 Mammographic calcification found on diagnostic imaging of breast: Secondary | ICD-10-CM

## 2020-04-14 DIAGNOSIS — N6489 Other specified disorders of breast: Secondary | ICD-10-CM

## 2020-04-19 ENCOUNTER — Other Ambulatory Visit: Payer: BC Managed Care – PPO

## 2020-04-23 ENCOUNTER — Ambulatory Visit
Admission: RE | Admit: 2020-04-23 | Discharge: 2020-04-23 | Disposition: A | Discharge status: Home / Self Care | Payer: BC Managed Care – PPO | Source: Ambulatory Visit | Attending: Family Medicine | Admitting: Family Medicine

## 2020-04-23 ENCOUNTER — Other Ambulatory Visit: Payer: Self-pay | Admitting: Family Medicine

## 2020-04-23 ENCOUNTER — Other Ambulatory Visit: Payer: Self-pay

## 2020-04-23 DIAGNOSIS — N6489 Other specified disorders of breast: Secondary | ICD-10-CM

## 2020-04-23 DIAGNOSIS — N6012 Diffuse cystic mastopathy of left breast: Secondary | ICD-10-CM | POA: Diagnosis not present

## 2020-04-23 DIAGNOSIS — R921 Mammographic calcification found on diagnostic imaging of breast: Secondary | ICD-10-CM

## 2020-04-23 HISTORY — PX: BREAST BIOPSY: SHX20

## 2020-07-26 DIAGNOSIS — J069 Acute upper respiratory infection, unspecified: Secondary | ICD-10-CM | POA: Diagnosis not present

## 2020-10-04 ENCOUNTER — Other Ambulatory Visit: Payer: Self-pay | Admitting: Family Medicine

## 2020-10-04 DIAGNOSIS — N6489 Other specified disorders of breast: Secondary | ICD-10-CM

## 2020-11-15 ENCOUNTER — Other Ambulatory Visit: Payer: Self-pay

## 2020-11-15 ENCOUNTER — Ambulatory Visit
Admission: RE | Admit: 2020-11-15 | Discharge: 2020-11-15 | Disposition: A | Discharge status: Home / Self Care | Payer: BC Managed Care – PPO | Source: Ambulatory Visit | Attending: Family Medicine | Admitting: Family Medicine

## 2020-11-15 DIAGNOSIS — R921 Mammographic calcification found on diagnostic imaging of breast: Secondary | ICD-10-CM | POA: Diagnosis not present

## 2020-11-15 DIAGNOSIS — R922 Inconclusive mammogram: Secondary | ICD-10-CM | POA: Diagnosis not present

## 2020-11-15 DIAGNOSIS — N6489 Other specified disorders of breast: Secondary | ICD-10-CM

## 2020-11-26 ENCOUNTER — Other Ambulatory Visit: Payer: Self-pay | Admitting: Family Medicine

## 2020-12-02 ENCOUNTER — Ambulatory Visit
Admission: EM | Admit: 2020-12-02 | Discharge: 2020-12-02 | Disposition: A | Discharge status: Home / Self Care | Payer: BC Managed Care – PPO | Attending: Emergency Medicine | Admitting: Emergency Medicine

## 2020-12-02 ENCOUNTER — Encounter: Payer: Self-pay | Admitting: Emergency Medicine

## 2020-12-02 ENCOUNTER — Other Ambulatory Visit: Payer: Self-pay

## 2020-12-02 DIAGNOSIS — R6889 Other general symptoms and signs: Secondary | ICD-10-CM

## 2020-12-02 DIAGNOSIS — J029 Acute pharyngitis, unspecified: Secondary | ICD-10-CM | POA: Diagnosis not present

## 2020-12-02 MED ORDER — OSELTAMIVIR PHOSPHATE 75 MG PO CAPS: 75.0000 mg | ORAL_CAPSULE | Freq: Two times a day (BID) | ORAL | 0 refills | Status: DC

## 2020-12-02 NOTE — ED Provider Notes (Signed)
[place]   [id number] 12/02/20 Arrival Time: 9678   CC: flu symptoms  SUBJECTIVE: History from: patient.  Megan Pruitt is a 50 y.o. female who presents with sore throat, headache, and low grade fever x 1-2 days.  Daughter tested positive for strep and flu.  Has tried OTC medications without relief.  Denies aggravating factors.  Reports previous symptoms in the past.   Denies SOB, wheezing, chest pain, nausea, changes in bowel or bladder habits.     ROS: As per HPI.  All other pertinent ROS negative.     Past Medical History:  Diagnosis Date  . Hypertension    Past Surgical History:  Procedure Laterality Date  . BREAST SURGERY     absess    No Known Allergies No current facility-administered medications on file prior to encounter.   Current Outpatient Medications on File Prior to Encounter  Medication Sig Dispense Refill  . atorvastatin (LIPITOR) 20 MG tablet Take 20 mg by mouth daily.    . ferrous sulfate 325 (65 FE) MG tablet Take 325 mg by mouth every other day.    . metoprolol tartrate (LOPRESSOR) 25 MG tablet Take 0.5 tablets (12.5 mg total) by mouth 2 (two) times daily. 90 tablet 3  . NIFEdipine (PROCARDIA-XL/NIFEDICAL-XL) 30 MG 24 hr tablet TAKE 1 TABLET BY MOUTH EVERY DAY 90 tablet 1   Social History   Socioeconomic History  . Marital status: Married    Spouse name: Not on file  . Number of children: Not on file  . Years of education: Not on file  . Highest education level: Not on file  Occupational History  . Not on file  Tobacco Use  . Smoking status: Never Smoker  . Smokeless tobacco: Never Used  Vaping Use  . Vaping Use: Never used  Substance and Sexual Activity  . Alcohol use: No  . Drug use: No  . Sexual activity: Yes  Other Topics Concern  . Not on file  Social History Narrative  . Not on file   Social Determinants of Health   Financial Resource Strain: Not on file  Food Insecurity: Not on file  Transportation Needs: Not on  file  Physical Activity: Not on file  Stress: Not on file  Social Connections: Not on file  Intimate Partner Violence: Not on file   Family History  Problem Relation Age of Onset  . Hypertension Mother   . Heart attack Father   . Hypertension Sister   . Hypertension Brother     OBJECTIVE:  Vitals:   12/02/20 0944 12/02/20 0945  BP: 128/84   Pulse: 80   Resp: 18   Temp: 98.7 F (37.1 C)   TempSrc: Oral   SpO2: 96%   Weight:  160 lb (72.6 kg)     General appearance: alert; appears fatigued, but nontoxic; speaking in full sentences and tolerating own secretions HEENT: NCAT; Ears: EACs clear, TMs pearly gray; Eyes: PERRL.  EOM grossly intact. Nose: nares patent without rhinorrhea, Throat: oropharynx clear, tonsils mildly erythematous not enlarged, uvula midline  Neck: supple without LAD Lungs: unlabored respirations, symmetrical air entry; cough: absent; no respiratory distress; CTAB Heart: regular rate and rhythm.   Skin: warm and dry Psychological: alert and cooperative; normal mood and affect   ASSESSMENT & PLAN:  1. Sore throat   2. Flu-like symptoms     Meds ordered this encounter  Medications  . oseltamivir (TAMIFLU) 75 MG capsule    Sig: Take 1 capsule (75 mg  total) by mouth 2 (two) times daily for 5 days.    Dispense:  10 capsule    Refill:  0    Order Specific Question:   Supervising Provider    Answer:   Raylene Everts [[id number]]     Flu testing ordered.  It will take between 3-5 days for test results.  Someone will contact you regarding abnormal results.   Get plenty of rest and push fluids Tamiflu prescribed.   Use OTC zyrtec for nasal congestion, runny nose, and/or sore throat Use OTC flonase for nasal congestion and runny nose Use medications daily for symptom relief Use OTC medications like ibuprofen or tylenol as needed fever or pain Call or go to the ED if you have any new or worsening symptoms such as fever, cough, shortness of breath,  chest tightness, chest pain, turning blue, changes in mental status, etc...   Reviewed expectations re: course of current medical issues. Questions answered. Outlined signs and symptoms indicating need for more acute intervention. Patient verbalized understanding. After Visit Summary given.         Lestine Box, PA-C 12/02/20 1021

## 2020-12-02 NOTE — ED Triage Notes (Signed)
Sore throat, headache with low grade fevers.  Pts daughter tested positive for flu on Saturday.

## 2020-12-02 NOTE — Discharge Instructions (Signed)
Flu testing ordered.  It will take between 3-5 days for test results.  Someone will contact you regarding abnormal results.   Get plenty of rest and push fluids Tamiflu prescribed.   Use OTC zyrtec for nasal congestion, runny nose, and/or sore throat Use OTC flonase for nasal congestion and runny nose Use medications daily for symptom relief Use OTC medications like ibuprofen or tylenol as needed fever or pain Call or go to the ED if you have any new or worsening symptoms such as fever, cough, shortness of breath, chest tightness, chest pain, turning blue, changes in mental status, etc..Marland Kitchen

## 2020-12-04 LAB — COVID-19, FLU A+B NAA
Influenza A, NAA: NOT DETECTED
Influenza B, NAA: NOT DETECTED
SARS-CoV-2, NAA: NOT DETECTED

## 2020-12-06 NOTE — Progress Notes (Signed)
Cardiology Office Note  Date: 12/07/2020   ID: Megan Pruitt, DOB 1970/12/26, MRN [id number]  PCP:  Sharilyn Sites, MD  Cardiologist:  No primary care provider on file. Electrophysiologist:  None   Chief Complaint: Follow-up: Palpitations, HTN, HLD  History of Present Illness: Megan Pruitt is a 50 y.o. female with a history of palpitations, HTN, HLD, IDA  Last visit was with Dr. Bronson Ing 11/01/2018.  At that visit she had no complaints of chest pain, palpitations, shortness of breath, lightheadedness, dizziness, leg swelling, orthopnea or PND.  Here today for 1 year follow-up.  States she has been doing well from a cardiac standpoint.  She denies any general or exertional symptoms, palpitations or arrhythmias, orthostatic symptoms, CVA or TIA-like symptoms, PND, orthopnea, bleeding, claudication-like symptoms, DVT or PE-like symptoms.  Blood pressures well controlled at 114/80.  Heart rate is 84 today.  Continuing atorvastatin, metoprolol, nifedipine.  Continuing ferrous sulfate for iron deficiency anemia.   Past Medical History:  Diagnosis Date  . Hypertension     Past Surgical History:  Procedure Laterality Date  . BREAST SURGERY     absess     Current Outpatient Medications  Medication Sig Dispense Refill  . atorvastatin (LIPITOR) 20 MG tablet Take 20 mg by mouth daily.    . ferrous sulfate 325 (65 FE) MG tablet Take 325 mg by mouth every other day.    . metoprolol tartrate (LOPRESSOR) 25 MG tablet Take 12.5 mg by mouth 2 (two) times daily.    Marland Kitchen NIFEdipine (PROCARDIA-XL/NIFEDICAL-XL) 30 MG 24 hr tablet TAKE 1 TABLET BY MOUTH EVERY DAY 90 tablet 1   No current facility-administered medications for this visit.   Allergies:  Patient has no known allergies.   Social History: The patient  reports that she has never smoked. She has never used smokeless tobacco. She reports that she does not drink alcohol and does not use drugs.   Family History: The patient's family  history includes Heart attack in her father; Hypertension in her brother, mother, and sister.   ROS:  Please see the history of present illness. Otherwise, complete review of systems is positive for none.  All other systems are reviewed and negative.   Physical Exam: VS:  BP 114/80   Pulse 84   Ht 5\' 5"  (1.651 m)   Wt 164 lb 3.2 oz (74.5 kg)   LMP 11/16/2020   SpO2 98%   BMI 27.32 kg/m , BMI Body mass index is 27.32 kg/m.  Wt Readings from Last 3 Encounters:  12/07/20 164 lb 3.2 oz (74.5 kg)  12/02/20 160 lb (72.6 kg)  12/08/19 165 lb 12.8 oz (75.2 kg)    General: Patient appears comfortable at rest. Neck: Supple, no elevated JVP or carotid bruits, no thyromegaly. Lungs: Clear to auscultation, nonlabored breathing at rest. Cardiac: Regular rate and rhythm, no S3 or significant systolic murmur, no pericardial rub. Extremities: No pitting edema, distal pulses 2+. Skin: Warm and dry. Musculoskeletal: No kyphosis. Neuropsychiatric: Alert and oriented x3, affect grossly appropriate.  ECG:  An ECG dated 12/07/2020 was personally reviewed today and demonstrated:  Normal sinus rhythm rate of 71.  T wave abnormality, consider inferior ischemia  Recent Labwork: No results found for requested labs within last 8760 hours.  No results found for: CHOL, TRIG, HDL, CHOLHDL, VLDL, LDLCALC, LDLDIRECT  Other Studies Reviewed Today:  Echocardiogram 06/01/2017 Study Conclusions   - Left ventricle: The cavity size was normal. Wall thickness was  normal. Systolic function was normal.  The estimated ejection  fraction was in the range of 60% to 65%. Wall motion was normal;  there were no regional wall motion abnormalities. Left  ventricular diastolic function parameters were normal.   Impressions:   - Normal study.   Assessment and Plan:  1. Palpitations   2. Essential hypertension   3. Mixed hyperlipidemia   4. Iron deficiency anemia, unspecified iron deficiency anemia type     1. Palpitations Denies any sensation of palpitations or arrhythmias in the interval since last visit.  Continue Metoprolol 12.5 mg p.o. twice daily and nifedipine 30 mg daily.  2. Essential hypertension Blood pressure is well controlled on current therapy.  BP 114/80.  Continue metoprolol 12.5 mg p.o. twice daily.  3. Mixed hyperlipidemia Patient states she had lab work with PCP in the interval since last visit and states everything was normal.  4. Iron deficiency anemia, unspecified iron deficiency anemia type PCP is managing anemia with ferrous sulfate 325 mg daily.  Follow-up with PCP for management.  Medication Adjustments/Labs and Tests Ordered: Current medicines are reviewed at length with the patient today.  Concerns regarding medicines are outlined above.   Disposition: Follow-up with Dr Dr. Harl Bowie or APP 1 year  Signed, Levell July, NP 12/07/2020 4:22 PM    [facility] [place], [place], [place] [address] Phone: [phone]; Fax: [phone]

## 2020-12-07 ENCOUNTER — Encounter: Payer: Self-pay | Admitting: Family Medicine

## 2020-12-07 ENCOUNTER — Ambulatory Visit: Payer: BC Managed Care – PPO | Admitting: Family Medicine

## 2020-12-07 VITALS — BP 114/80 | HR 84 | Ht 65.0 in | Wt 164.2 lb

## 2020-12-07 DIAGNOSIS — E782 Mixed hyperlipidemia: Secondary | ICD-10-CM

## 2020-12-07 DIAGNOSIS — R002 Palpitations: Secondary | ICD-10-CM

## 2020-12-07 DIAGNOSIS — D509 Iron deficiency anemia, unspecified: Secondary | ICD-10-CM | POA: Diagnosis not present

## 2020-12-07 DIAGNOSIS — I10 Essential (primary) hypertension: Secondary | ICD-10-CM | POA: Diagnosis not present

## 2020-12-07 NOTE — Patient Instructions (Signed)
Your physician wants you to follow-up in: 1 YEAR WITH MD You will receive a reminder letter in the mail two months in advance. If you don't receive a letter, please call our office to schedule the follow-up appointment.  Your physician recommends that you continue on your current medications as directed. Please refer to the Current Medication list given to you today.  Thank you for choosing [facility]!!   

## 2021-01-07 ENCOUNTER — Other Ambulatory Visit: Payer: Self-pay | Admitting: Family Medicine

## 2021-05-23 ENCOUNTER — Other Ambulatory Visit: Payer: Self-pay | Admitting: Family Medicine

## 2021-06-18 ENCOUNTER — Other Ambulatory Visit: Payer: Self-pay | Admitting: Family Medicine

## 2021-07-05 DIAGNOSIS — I10 Essential (primary) hypertension: Secondary | ICD-10-CM | POA: Diagnosis not present

## 2021-07-05 DIAGNOSIS — E663 Overweight: Secondary | ICD-10-CM | POA: Diagnosis not present

## 2021-07-05 DIAGNOSIS — J029 Acute pharyngitis, unspecified: Secondary | ICD-10-CM | POA: Diagnosis not present

## 2021-07-05 DIAGNOSIS — Z6827 Body mass index (BMI) 27.0-27.9, adult: Secondary | ICD-10-CM | POA: Diagnosis not present

## 2021-07-05 DIAGNOSIS — J329 Chronic sinusitis, unspecified: Secondary | ICD-10-CM | POA: Diagnosis not present

## 2021-07-05 DIAGNOSIS — J042 Acute laryngotracheitis: Secondary | ICD-10-CM | POA: Diagnosis not present

## 2021-07-08 DIAGNOSIS — Z0001 Encounter for general adult medical examination with abnormal findings: Secondary | ICD-10-CM | POA: Diagnosis not present

## 2021-07-08 DIAGNOSIS — E559 Vitamin D deficiency, unspecified: Secondary | ICD-10-CM | POA: Diagnosis not present

## 2021-07-08 DIAGNOSIS — I10 Essential (primary) hypertension: Secondary | ICD-10-CM | POA: Diagnosis not present

## 2021-07-08 DIAGNOSIS — J029 Acute pharyngitis, unspecified: Secondary | ICD-10-CM | POA: Diagnosis not present

## 2021-07-08 DIAGNOSIS — J042 Acute laryngotracheitis: Secondary | ICD-10-CM | POA: Diagnosis not present

## 2021-07-08 DIAGNOSIS — J329 Chronic sinusitis, unspecified: Secondary | ICD-10-CM | POA: Diagnosis not present

## 2021-07-08 DIAGNOSIS — E782 Mixed hyperlipidemia: Secondary | ICD-10-CM | POA: Diagnosis not present

## 2021-07-08 DIAGNOSIS — E538 Deficiency of other specified B group vitamins: Secondary | ICD-10-CM | POA: Diagnosis not present

## 2021-07-08 DIAGNOSIS — Z13220 Encounter for screening for lipoid disorders: Secondary | ICD-10-CM | POA: Diagnosis not present

## 2021-07-08 DIAGNOSIS — Z6827 Body mass index (BMI) 27.0-27.9, adult: Secondary | ICD-10-CM | POA: Diagnosis not present

## 2021-07-23 ENCOUNTER — Ambulatory Visit
Admission: EM | Admit: 2021-07-23 | Discharge: 2021-07-23 | Disposition: A | Discharge status: Home / Self Care | Payer: BC Managed Care – PPO | Attending: Physician Assistant | Admitting: Physician Assistant

## 2021-07-23 DIAGNOSIS — R059 Cough, unspecified: Secondary | ICD-10-CM

## 2021-07-23 DIAGNOSIS — U071 COVID-19: Secondary | ICD-10-CM

## 2021-07-23 MED ORDER — NIRMATRELVIR/RITONAVIR (PAXLOVID)TABLET: 3.0000 | ORAL_TABLET | Freq: Two times a day (BID) | ORAL | 0 refills | Status: AC

## 2021-07-23 MED ORDER — NIRMATRELVIR/RITONAVIR (PAXLOVID)TABLET: 3.0000 | ORAL_TABLET | Freq: Two times a day (BID) | ORAL | Status: DC

## 2021-07-23 NOTE — ED Triage Notes (Signed)
X3 days  Pt states that she has a fever and cough.   Pt states that she had positive covid test 07/22/2021.  Pt states that she is vaccinated.  Pt states that she hasn't had flu vaccine.

## 2021-07-23 NOTE — ED Provider Notes (Signed)
[facility]    CSN: [id number] Arrival date & time: 07/23/21  0940      History   Chief Complaint Chief Complaint  Patient presents with   Fever    X3 days Fever and cough.     HPI Megan Pruitt is a 50 y.o. female.   Pt had a positive home covid test.    The history is provided by the patient. No language interpreter was used.  Fever Temp source:  Subjective Severity:  Severe Onset quality:  Gradual Duration:  4 days Timing:  Constant Progression:  Worsening Chronicity:  New Relieved by:  Nothing Worsened by:  Nothing Ineffective treatments:  None tried Associated symptoms: congestion and headaches   Risk factors: recent sickness    Past Medical History:  Diagnosis Date   Hypertension     Patient Active Problem List   Diagnosis Date Noted   Lightheaded 05/29/2017   Irregular heart beat 05/29/2017   Chest pain 05/29/2017   Esophageal reflux 05/29/2017   Overweight 05/29/2017    Past Surgical History:  Procedure Laterality Date   BREAST SURGERY     absess     OB History   No obstetric history on file.      Home Medications    Prior to Admission medications   Medication Sig Start Date End Date Taking? Authorizing Provider  atorvastatin (LIPITOR) 20 MG tablet Take 20 mg by mouth daily. 10/14/18  Yes [provider]  ferrous sulfate 325 (65 FE) MG tablet Take 325 mg by mouth every other day.   Yes [provider]  metoprolol tartrate (LOPRESSOR) 25 MG tablet TAKE 0.5 TABLETS BY MOUTH 2 TIMES DAILY. 05/24/21  Yes Verta Ellen., NP  NIFEdipine (PROCARDIA-XL/NIFEDICAL-XL) 30 MG 24 hr tablet TAKE 1 TABLET BY MOUTH EVERY DAY 06/20/21  Yes Verta Ellen., NP  nirmatrelvir/ritonavir EUA (PAXLOVID) 20 x 150 MG & 10 x 100MG  TABS Take 3 tablets by mouth 2 (two) times daily for 5 days. Patient GFR is normal Take nirmatrelvir (150 mg) two tablets twice daily for 5 days and ritonavir (100 mg) one tablet twice daily for  5 days. 07/23/21 07/28/21 Yes Fransico Meadow, PA-C    Family History Family History  Problem Relation Age of Onset   Hypertension Mother    Heart attack Father    Hypertension Sister    Hypertension Brother     Social History Social History   Tobacco Use   Smoking status: Never   Smokeless tobacco: Never  Vaping Use   Vaping Use: Never used  Substance Use Topics   Alcohol use: No   Drug use: No     Allergies   Patient has no known allergies.   Review of Systems Review of Systems  Constitutional:  Positive for fever.  HENT:  Positive for congestion.   Neurological:  Positive for headaches.  All other systems reviewed and are negative.   Physical Exam Triage Vital Signs ED Triage Vitals  Enc Vitals Group     BP 07/23/21 1251 (!) 149/99     Pulse Rate 07/23/21 1251 (!) 138     Resp 07/23/21 1251 18     Temp 07/23/21 1251 98 F (36.7 C)     Temp Source 07/23/21 1251 Temporal     SpO2 07/23/21 1251 95 %     Weight 07/23/21 1208 160 lb (72.6 kg)     Height 07/23/21 1208 5\' 4"  (1.626 m)  Head Circumference --      Peak Flow --      Pain Score 07/23/21 1207 5     Pain Loc --      Pain Edu? --      Excl. in [place]? --    No data found.  Updated Vital Signs BP (!) 149/99 (BP Location: Right Arm)   Pulse (!) 138   Temp 98 F (36.7 C) (Temporal)   Resp 18   Ht 5\' 4"  (1.626 m)   Wt 72.6 kg   SpO2 95%   BMI 27.46 kg/m   Visual Acuity Right Eye Distance:   Left Eye Distance:   Bilateral Distance:    Right Eye Near:   Left Eye Near:    Bilateral Near:     Physical Exam Vitals and nursing note reviewed.  Constitutional:      Appearance: She is well-developed.  HENT:     Head: Normocephalic.  Pulmonary:     Effort: Pulmonary effort is normal.  Abdominal:     General: There is no distension.  Musculoskeletal:        General: Normal range of motion.     Cervical back: Normal range of motion.  Neurological:     Mental Status: She is alert and  oriented to person, place, and time.     UC Treatments / Results  Labs (all labs ordered are listed, but only abnormal results are displayed) Labs Reviewed  COVID-19, FLU A+B NAA    EKG   Radiology No results found.  Procedures Procedures (including critical care time)  Medications Ordered in UC Medications  nirmatrelvir/ritonavir EUA (PAXLOVID) 3 tablet (has no administration in time range)    Initial Impression / Assessment and Plan / UC Course  I have reviewed the triage vital signs and the nursing notes.  Pertinent labs & imaging results that were available during my care of the patient were reviewed by me and considered in my medical decision making (see chart for details).     PT had labs at primary care within 6 months.  Pt given rx for paxlovid   Final Clinical Impressions(s) / UC Diagnoses   Final diagnoses:  Cough, unspecified type  COVID   Discharge Instructions   None    ED Prescriptions     Medication Sig Dispense Auth. Provider   nirmatrelvir/ritonavir EUA (PAXLOVID) 20 x 150 MG & 10 x 100MG  TABS Take 3 tablets by mouth 2 (two) times daily for 5 days. Patient GFR is normal Take nirmatrelvir (150 mg) two tablets twice daily for 5 days and ritonavir (100 mg) one tablet twice daily for 5 days. 30 tablet Fransico Meadow, [place]      PDMP not reviewed this encounter. An After Visit Summary was printed and given to the patient.    Fransico Meadow, [place] 07/23/21 1649

## 2021-07-24 LAB — COVID-19, FLU A+B NAA
Influenza A, NAA: NOT DETECTED
Influenza B, NAA: NOT DETECTED
SARS-CoV-2, NAA: DETECTED — AB

## 2021-09-15 ENCOUNTER — Other Ambulatory Visit: Payer: Self-pay | Admitting: *Deleted

## 2021-09-15 MED ORDER — METOPROLOL TARTRATE 25 MG PO TABS: ORAL_TABLET | ORAL | 3 refills | Status: DC

## 2021-10-12 ENCOUNTER — Other Ambulatory Visit: Payer: Self-pay | Admitting: Family Medicine

## 2021-10-12 DIAGNOSIS — Z1231 Encounter for screening mammogram for malignant neoplasm of breast: Secondary | ICD-10-CM

## 2021-10-17 ENCOUNTER — Ambulatory Visit
Admission: RE | Admit: 2021-10-17 | Discharge: 2021-10-17 | Disposition: A | Discharge status: Home / Self Care | Payer: BC Managed Care – PPO | Source: Ambulatory Visit | Attending: Family Medicine | Admitting: Family Medicine

## 2021-10-17 ENCOUNTER — Other Ambulatory Visit: Payer: Self-pay

## 2021-10-17 VITALS — BP 149/88 | HR 114 | Temp 102.7°F | Resp 18

## 2021-10-17 DIAGNOSIS — R000 Tachycardia, unspecified: Secondary | ICD-10-CM | POA: Diagnosis not present

## 2021-10-17 DIAGNOSIS — R509 Fever, unspecified: Secondary | ICD-10-CM | POA: Diagnosis not present

## 2021-10-17 DIAGNOSIS — R52 Pain, unspecified: Secondary | ICD-10-CM | POA: Insufficient documentation

## 2021-10-17 DIAGNOSIS — Z20828 Contact with and (suspected) exposure to other viral communicable diseases: Secondary | ICD-10-CM | POA: Insufficient documentation

## 2021-10-17 DIAGNOSIS — J029 Acute pharyngitis, unspecified: Secondary | ICD-10-CM | POA: Diagnosis not present

## 2021-10-17 LAB — POCT RAPID STREP A (OFFICE): Rapid Strep A Screen: NEGATIVE

## 2021-10-17 MED ORDER — PROMETHAZINE-DM 6.25-15 MG/5ML PO SYRP: 5.0000 mL | ORAL_SOLUTION | Freq: Four times a day (QID) | ORAL | 0 refills | Status: DC | PRN

## 2021-10-17 MED ORDER — LIDOCAINE VISCOUS HCL 2 % MT SOLN: 10.0000 mL | OROMUCOSAL | 0 refills | Status: DC | PRN

## 2021-10-17 NOTE — ED Triage Notes (Signed)
Patient states that she has a fever, headache and sore throat since yesterday  Patient states she is having Body aches  Patient states she took a Goodie Powder for the fever about 5 hours ago and it did bring the fever down

## 2021-10-18 LAB — COVID-19, FLU A+B NAA
Influenza A, NAA: NOT DETECTED
Influenza B, NAA: NOT DETECTED
SARS-CoV-2, NAA: NOT DETECTED

## 2021-10-19 DIAGNOSIS — I10 Essential (primary) hypertension: Secondary | ICD-10-CM | POA: Diagnosis not present

## 2021-10-19 DIAGNOSIS — J042 Acute laryngotracheitis: Secondary | ICD-10-CM | POA: Diagnosis not present

## 2021-10-19 DIAGNOSIS — Z6827 Body mass index (BMI) 27.0-27.9, adult: Secondary | ICD-10-CM | POA: Diagnosis not present

## 2021-10-19 DIAGNOSIS — J329 Chronic sinusitis, unspecified: Secondary | ICD-10-CM | POA: Diagnosis not present

## 2021-10-19 DIAGNOSIS — J029 Acute pharyngitis, unspecified: Secondary | ICD-10-CM | POA: Diagnosis not present

## 2021-10-20 LAB — CULTURE, GROUP A STREP (THRC)

## 2021-10-21 NOTE — ED Provider Notes (Signed)
[facility]    CSN: [id number] Arrival date & time: 10/17/21  1643      History   Chief Complaint Chief Complaint  Patient presents with   Sore Throat    Sore throat, fever and nausea    HPI Megan Pruitt is a 51 y.o. female.   Presenting today with 1 day history of fever, sore throat, headache, body aches.  Denies chest pain, shortness of breath, cough, congestion, abdominal pain, nausea vomiting or diarrhea.  Taking NSAIDs with only mild temporary relief of symptoms.  No known sick contacts recently.  No known pertinent chronic medical problems.   Past Medical History:  Diagnosis Date   Hypertension     Patient Active Problem List   Diagnosis Date Noted   Lightheaded 05/29/2017   Irregular heart beat 05/29/2017   Chest pain 05/29/2017   Esophageal reflux 05/29/2017   Overweight 05/29/2017    Past Surgical History:  Procedure Laterality Date   BREAST SURGERY     absess     OB History   No obstetric history on file.      Home Medications    Prior to Admission medications   Medication Sig Start Date End Date Taking? Authorizing Provider  lidocaine (XYLOCAINE) 2 % solution Use as directed 10 mLs in the mouth or throat every 3 (three) hours as needed for mouth pain. 10/17/21  Yes Volney American, PA-C  promethazine-dextromethorphan (PROMETHAZINE-DM) 6.25-15 MG/5ML syrup Take 5 mLs by mouth 4 (four) times daily as needed. 10/17/21  Yes Volney American, PA-C  atorvastatin (LIPITOR) 20 MG tablet Take 20 mg by mouth daily. 10/14/18   [provider]  ferrous sulfate 325 (65 FE) MG tablet Take 325 mg by mouth every other day.    [provider]  metoprolol tartrate (LOPRESSOR) 25 MG tablet TAKE 0.5 TABLETS BY MOUTH 2 TIMES DAILY. 09/15/21   Ahmed Prima, Fransisco Hertz, PA-C  NIFEdipine (PROCARDIA-XL/NIFEDICAL-XL) 30 MG 24 hr tablet TAKE 1 TABLET BY MOUTH EVERY DAY 06/20/21   Verta Ellen., NP    Family History Family  History  Problem Relation Age of Onset   Hypertension Mother    Heart attack Father    Hypertension Sister    Hypertension Brother     Social History Social History   Tobacco Use   Smoking status: Never   Smokeless tobacco: Never  Vaping Use   Vaping Use: Never used  Substance Use Topics   Alcohol use: No   Drug use: No     Allergies   Patient has no known allergies.   Review of Systems Review of Systems Per HPI  Physical Exam Triage Vital Signs ED Triage Vitals [10/17/21 1727]  Enc Vitals Group     BP (!) 149/88     Pulse Rate (!) 114     Resp 18     Temp (!) 102.7 F (39.3 C)     Temp Source Oral     SpO2 96 %     Weight      Height      Head Circumference      Peak Flow      Pain Score      Pain Loc      Pain Edu?      Excl. in [place]?    No data found.  Updated Vital Signs BP (!) 149/88 (BP Location: Right Arm)    Pulse (!) 114    Temp (!) 102.7 F (39.3 C) (  Oral)    Resp 18    LMP 08/19/2021 (Approximate) Comment: Cycles are irregular   SpO2 96%   Visual Acuity Right Eye Distance:   Left Eye Distance:   Bilateral Distance:    Right Eye Near:   Left Eye Near:    Bilateral Near:     Physical Exam Vitals and nursing note reviewed.  Constitutional:      Appearance: Normal appearance.  HENT:     Head: Atraumatic.     Right Ear: Tympanic membrane and external ear normal.     Left Ear: Tympanic membrane and external ear normal.     Nose: Nose normal.     Mouth/Throat:     Mouth: Mucous membranes are moist.     Pharynx: Posterior oropharyngeal erythema present. No oropharyngeal exudate.     Comments: Uvula midline, oral airway patent Eyes:     Extraocular Movements: Extraocular movements intact.     Conjunctiva/sclera: Conjunctivae normal.  Cardiovascular:     Rate and Rhythm: Normal rate and regular rhythm.     Heart sounds: Normal heart sounds.  Pulmonary:     Effort: Pulmonary effort is normal.     Breath sounds: Normal breath sounds.  No wheezing or rales.  Musculoskeletal:        General: Normal range of motion.     Cervical back: Normal range of motion and neck supple.  Skin:    General: Skin is warm and dry.  Neurological:     Mental Status: She is alert and oriented to person, place, and time.  Psychiatric:        Mood and Affect: Mood normal.        Thought Content: Thought content normal.     UC Treatments / Results  Labs (all labs ordered are listed, but only abnormal results are displayed) Labs Reviewed  COVID-19, FLU A+B NAA   Narrative:    Performed at:  [address] [place], [place]  [id number] Lab Director: Rush Farmer MD, Phone:  [phone]  CULTURE, GROUP A STREP [facility])  POCT RAPID STREP A (OFFICE)    EKG   Radiology No results found.  Procedures Procedures (including critical care time)  Medications Ordered in UC Medications - No data to display  Initial Impression / Assessment and Plan / UC Course  I have reviewed the triage vital signs and the nursing notes.  Pertinent labs & imaging results that were available during my care of the patient were reviewed by me and considered in my medical decision making (see chart for details).     Febrile and tachycardic in triage, rapid strep negative, throat culture and COVID and flu testing pending.  We will treat symptomatically with Phenergan DM, viscous lidocaine and await results.  Discussed supportive over-the-counter medications and home care.  Return for acutely worsening symptoms.  Final Clinical Impressions(s) / UC Diagnoses   Final diagnoses:  Exposure to the flu  Sore throat  Fever, unspecified  Generalized body aches  Tachycardia   Discharge Instructions   None    ED Prescriptions     Medication Sig Dispense Auth. Provider   promethazine-dextromethorphan (PROMETHAZINE-DM) 6.25-15 MG/5ML syrup Take 5 mLs by mouth 4 (four) times daily as needed. 100 mL Volney American, PA-C    lidocaine (XYLOCAINE) 2 % solution Use as directed 10 mLs in the mouth or throat every 3 (three) hours as needed for mouth pain. 100 mL Volney American, [place]  PDMP not reviewed this encounter.   Volney American, [place] 10/21/21 1932

## 2021-12-02 ENCOUNTER — Ambulatory Visit
Admission: RE | Admit: 2021-12-02 | Discharge: 2021-12-02 | Disposition: A | Discharge status: Home / Self Care | Payer: BC Managed Care – PPO | Source: Ambulatory Visit | Attending: Family Medicine | Admitting: Family Medicine

## 2021-12-02 DIAGNOSIS — Z1231 Encounter for screening mammogram for malignant neoplasm of breast: Secondary | ICD-10-CM | POA: Diagnosis not present

## 2021-12-22 ENCOUNTER — Encounter: Payer: Self-pay | Admitting: Cardiology

## 2021-12-22 ENCOUNTER — Ambulatory Visit: Payer: BC Managed Care – PPO | Admitting: Cardiology

## 2021-12-22 VITALS — BP 132/72 | HR 79 | Ht 64.0 in | Wt 165.4 lb

## 2021-12-22 DIAGNOSIS — R002 Palpitations: Secondary | ICD-10-CM

## 2021-12-22 DIAGNOSIS — I10 Essential (primary) hypertension: Secondary | ICD-10-CM | POA: Diagnosis not present

## 2021-12-22 DIAGNOSIS — E7800 Pure hypercholesterolemia, unspecified: Secondary | ICD-10-CM

## 2021-12-22 DIAGNOSIS — Z8249 Family history of ischemic heart disease and other diseases of the circulatory system: Secondary | ICD-10-CM

## 2021-12-22 NOTE — Assessment & Plan Note (Signed)
Continue with atorvastatin 40 mg a day.  Excellent.  No changes made.  No myalgias.  Refills as needed ?

## 2021-12-22 NOTE — Progress Notes (Signed)
?Cardiology Office Note:   ? ?Date:  12/22/2021  ? ?ID:  Megan Pruitt, DOB 06/18/1971, MRN [id number] ? ?PCP:  Sharilyn Sites, MD ?  ?[facility] Providers ?Cardiologist:  None    ? ?Referring MD: Sharilyn Sites, MD  ? ? ?History of Present Illness:   ? ?Megan Pruitt is a 51 y.o. female here for the follow-up of palpitations hypertension hyperlipidemia.  Doing well.  On atorvastatin metoprolol and nifedipine.  Heart attack in her father age 61.  Prior EKG showed T wave abnormality possible inferior ischemia ? ?Echocardiogram 2018 showed normal EF 65% ? ?Overall she is doing very well has not had any palpitations with both metoprolol and the nifedipine.  Feels great. ? ?Past Medical History:  ?Diagnosis Date  ? Hyperlipidemia   ? Hypertension   ? ? ?Past Surgical History:  ?Procedure Laterality Date  ? BREAST BIOPSY Left 04/23/2020  ? BREAST SURGERY    ? absess   ? ? ?Current Medications: ?Current Meds  ?Medication Sig  ? atorvastatin (LIPITOR) 40 MG tablet Take 40 mg by mouth daily.  ? metoprolol tartrate (LOPRESSOR) 25 MG tablet TAKE 0.5 TABLETS BY MOUTH 2 TIMES DAILY.  ? NIFEdipine (PROCARDIA-XL/NIFEDICAL-XL) 30 MG 24 hr tablet TAKE 1 TABLET BY MOUTH EVERY DAY  ?  ? ?Allergies:   Patient has no known allergies.  ? ?Social History  ? ?Socioeconomic History  ? Marital status: Married  ?  Spouse name: Not on file  ? Number of children: Not on file  ? Years of education: Not on file  ? Highest education level: Not on file  ?Occupational History  ? Not on file  ?Tobacco Use  ? Smoking status: Never  ? Smokeless tobacco: Never  ?Vaping Use  ? Vaping Use: Never used  ?Substance and Sexual Activity  ? Alcohol use: No  ? Drug use: No  ? Sexual activity: Yes  ?  Birth control/protection: None  ?Other Topics Concern  ? Not on file  ?Social History Narrative  ? Not on file  ? ?Social Determinants of Health  ? ?Financial Resource Strain: Not on file  ?Food Insecurity: Not on file  ?Transportation Needs: Not on file   ?Physical Activity: Not on file  ?Stress: Not on file  ?Social Connections: Not on file  ?  ? ?Family History: ?The patient's family history includes Heart attack in her father; Hypertension in her brother, mother, and sister. ? ?ROS:   ?Please see the history of present illness.    ? All other systems reviewed and are negative. ? ?EKGs/Labs/Other Studies Reviewed:   ? ?The following studies were reviewed today: ?Prior echocardiogram as above ? ?EKG:  EKG is  ordered today.  The ekg ordered today demonstrates sinus rhythm nonspecific T wave changes.  No change from prior. ? ?Recent Labs: ?No results found for requested labs within last 8760 hours.  ?Recent Lipid Panel ?No results found for: CHOL, TRIG, HDL, CHOLHDL, VLDL, LDLCALC, LDLDIRECT ? ? ?Risk Assessment/Calculations:   ? ? ?    ? ?   ? ?Physical Exam:   ? ?VS:  BP 132/72   Pulse 79   Ht '5\' 4"'$  (1.626 m)   Wt 165 lb 6.4 oz (75 kg)   SpO2 97%   BMI 28.39 kg/m?    ? ?Wt Readings from Last 3 Encounters:  ?12/22/21 165 lb 6.4 oz (75 kg)  ?07/23/21 160 lb (72.6 kg)  ?12/07/20 164 lb 3.2 oz (74.5 kg)  ?  ? ?  GEN:  Well nourished, well developed in no acute distress ?HEENT: Normal ?NECK: No JVD; No carotid bruits ?LYMPHATICS: No lymphadenopathy ?CARDIAC: RRR, no murmurs, no rubs, gallops ?RESPIRATORY:  Clear to auscultation without rales, wheezing or rhonchi  ?ABDOMEN: Soft, non-tender, non-distended ?MUSCULOSKELETAL:  No edema; No deformity  ?SKIN: Warm and dry ?NEUROLOGIC:  Alert and oriented x 3 ?PSYCHIATRIC:  Normal affect  ? ?ASSESSMENT:   ? ?1. Essential hypertension   ?2. Palpitations   ?3. Family history of early CAD   ?4. Pure hypercholesterolemia   ? ?PLAN:   ? ?In order of problems listed above: ? ?Palpitations ?Doing very well on metoprolol continue.  Has also been on nifedipine as well.  Low-dose.  Doing well. ? ?Family history of early CAD ?Continue with atorvastatin.  Excellent.  Protective.  In the future, could consider coronary calcium score  out of curiosity to see if higher intensity statin or lowering of the LDL may be more beneficial.  For now, continue with goal LDL of less than 70. ? ?Pure hypercholesterolemia ?Continue with atorvastatin 40 mg a day.  Excellent.  No changes made.  No myalgias.  Refills as needed ?  ? ? ?  ? ? ?Medication Adjustments/Labs and Tests Ordered: ?Current medicines are reviewed at length with the patient today.  Concerns regarding medicines are outlined above.  ?Orders Placed This Encounter  ?Procedures  ? EKG 12-Lead  ? ?No orders of the defined types were placed in this encounter. ? ? ?Patient Instructions  ?Medication Instructions:  ?Your physician recommends that you continue on your current medications as directed. Please refer to the Current Medication list given to you today. ? ? ?Labwork: ?None  ? ?Testing/Procedures: ?None  ? ?Follow-Up: ?1 year ? ?Any Other Special Instructions Will Be Listed Below (If Applicable). ? ?If you need a refill on your cardiac medications before your next appointment, please call your pharmacy. ?  ? ?Signed, ?Candee Furbish, MD  ?12/22/2021 4:54 PM    ?[place] ?

## 2021-12-22 NOTE — Patient Instructions (Signed)
Medication Instructions:  ?Your physician recommends that you continue on your current medications as directed. Please refer to the Current Medication list given to you today. ? ? ?Labwork: ?None  ? ?Testing/Procedures: ?None  ? ?Follow-Up: ?1 year ? ?Any Other Special Instructions Will Be Listed Below (If Applicable). ? ?If you need a refill on your cardiac medications before your next appointment, please call your pharmacy. ? ?

## 2021-12-22 NOTE — Assessment & Plan Note (Addendum)
Continue with atorvastatin.  Excellent.  Protective.  In the future, could consider coronary calcium score out of curiosity to see if higher intensity statin or lowering of the LDL may be more beneficial.  For now, continue with goal LDL of less than 70. ?

## 2021-12-22 NOTE — Assessment & Plan Note (Addendum)
Doing very well on metoprolol continue.  Has also been on nifedipine as well.  Low-dose.  Doing well. ?

## 2022-01-04 IMAGING — MG DIGITAL DIAGNOSTIC BILAT W/ TOMO W/ CAD
8 of 17 series · 8 of 40 positions shown · non-contrast
Comparison: None.

CLINICAL DATA: 58-year-old female with history of RIGHT breast
abscess in the remote past. For baseline mammogram.

EXAM:
DIGITAL DIAGNOSTIC BILATERAL MAMMOGRAM WITH CAD AND TOMO
ULTRASOUND LEFT BREAST

[L ML]
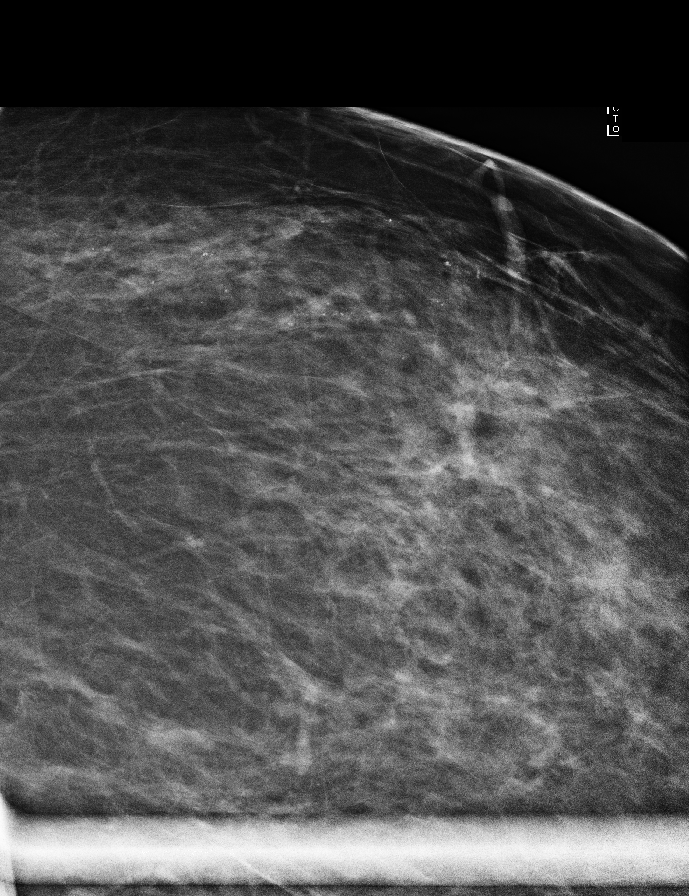

[L CC]
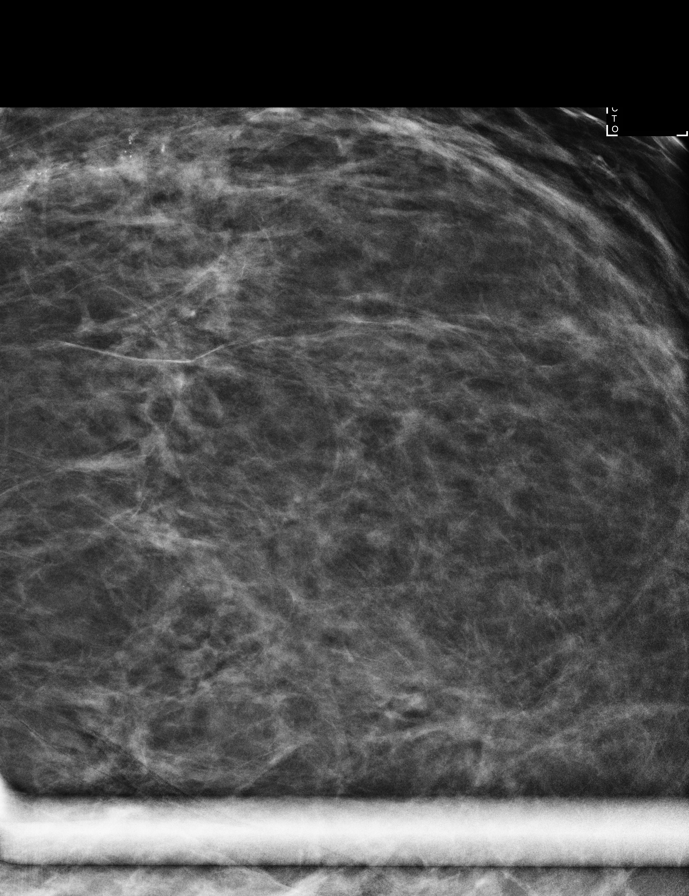

[L CC synth-2D (1 of 2)]
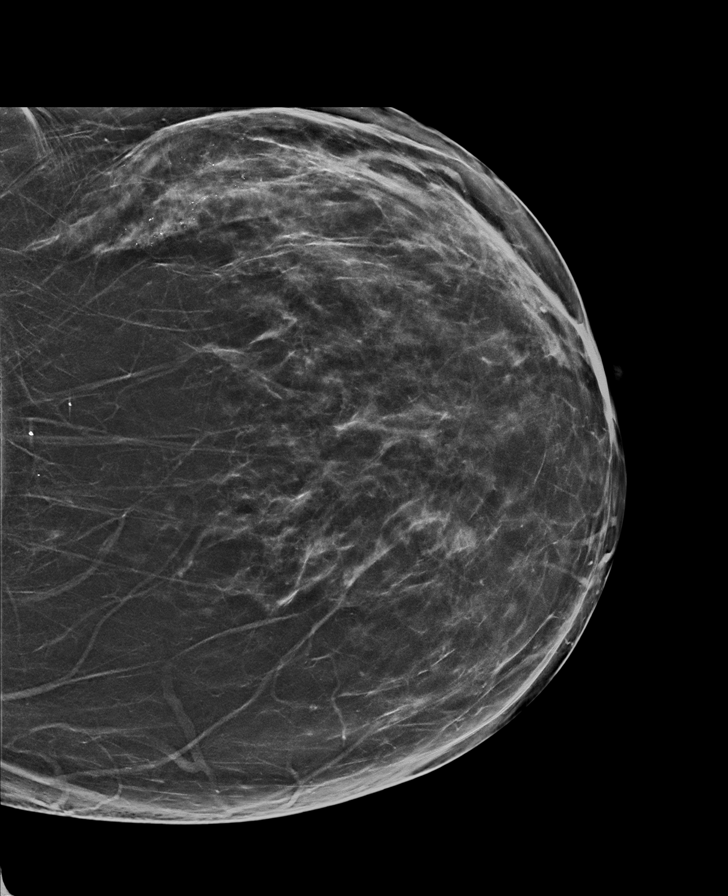

[R CC synth-2D (1 of 2)]
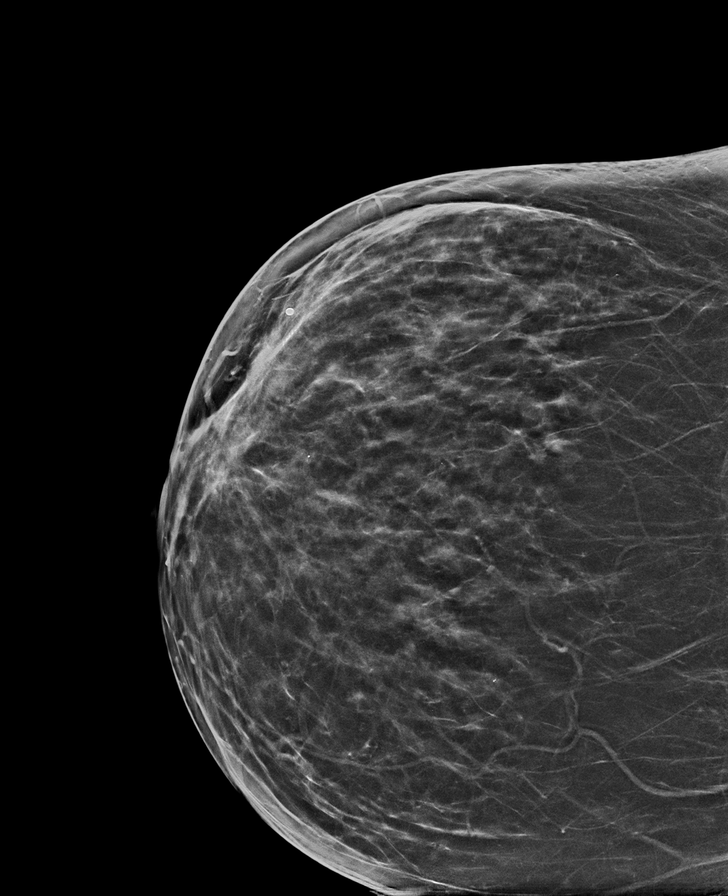

[L MLO synth-2D (1 of 2)]
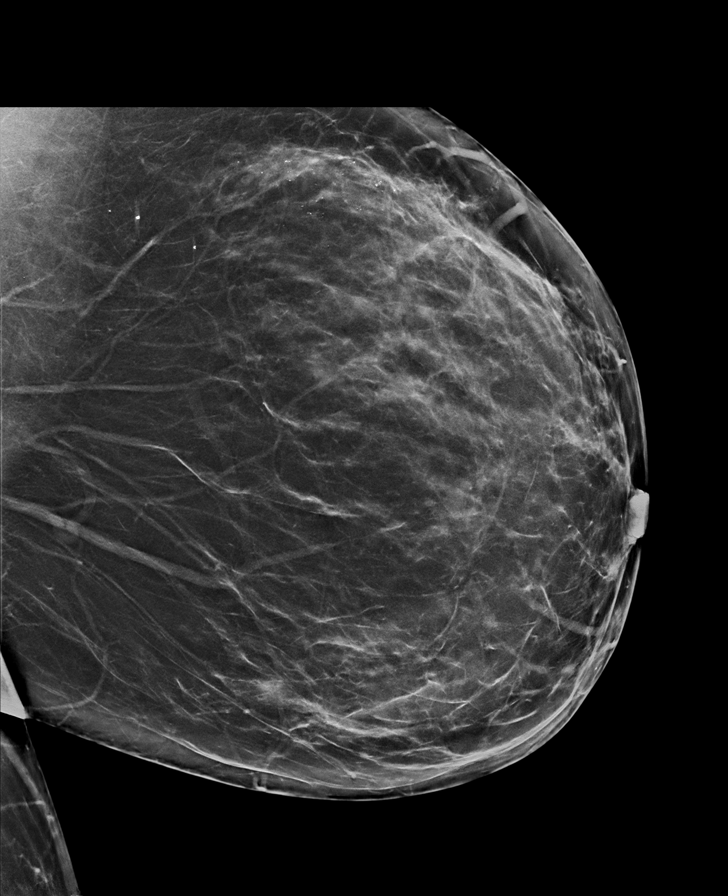

[L CC synth-2D (2 of 2)]
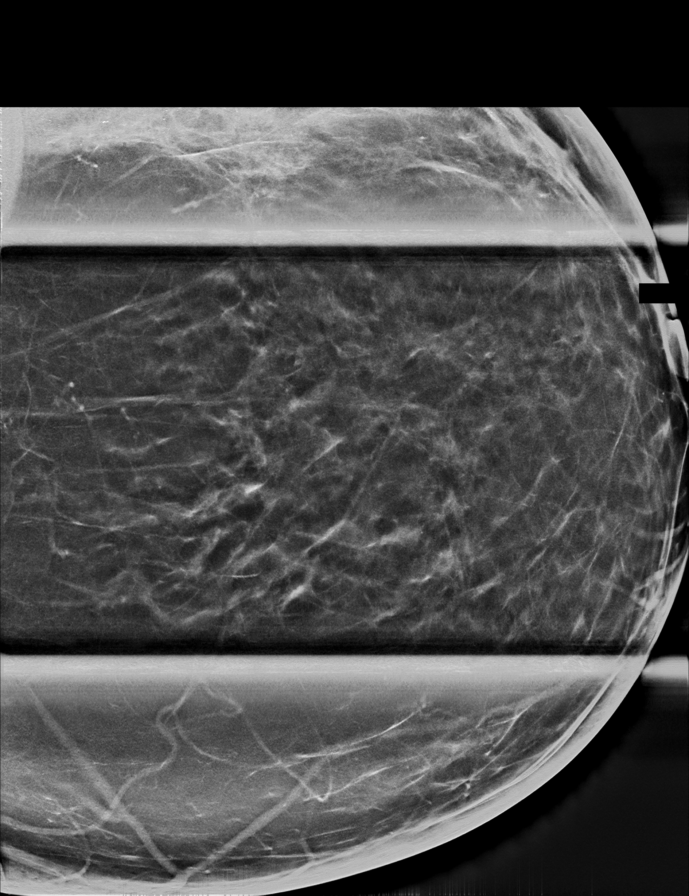

[R CC synth-2D (2 of 2)]
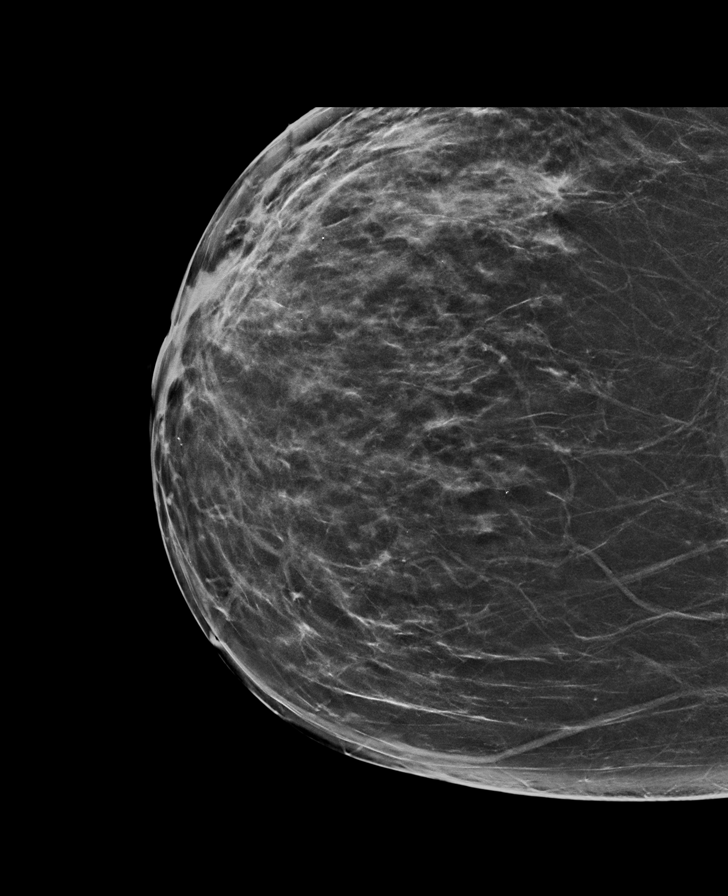

[L MLO synth-2D (2 of 2)]
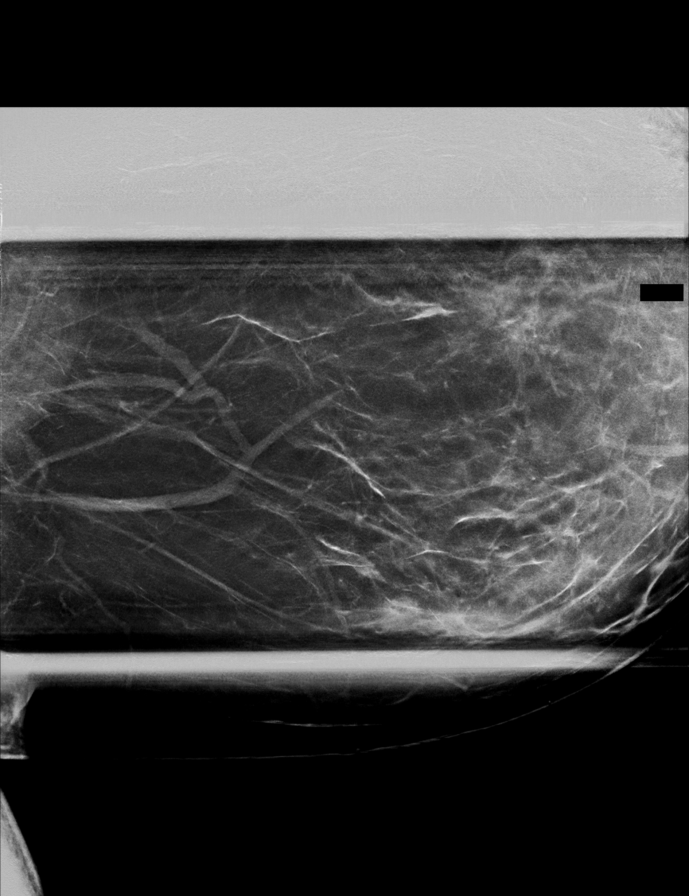

[8 of 40 positions shown; findings below may reference images not displayed]

ACR Breast Density Category b: There are scattered areas of
fibroglandular density.
FINDINGS: 2D/3D full field views of both breasts and spot compression and
magnification views of the LEFT breast are performed.

A 5 cm group of calcifications within the UPPER OUTER LEFT breast
consist of primarily round forms, but some are in a linear
orientation.

A 0.8 cm asymmetry within the anterior INNER LEFT breast is
identified

Mammographic images were processed with CAD.

Targeted ultrasound is performed, showing no definite sonographic
correlate to the asymmetry identified within the INNER LEFT breast.

No abnormal LEFT axillary lymph nodes are identified.
IMPRESSION: 1. 5 cm group of indeterminate UPPER-OUTER LEFT breast
calcifications. Tissue sampling is recommended.
2. Indeterminate 0.8 cm asymmetry within the INNER LEFT breast.
Tissue sampling is recommended.

RECOMMENDATION:
3D guided biopsies of UPPER-OUTER LEFT breast calcifications and
INNER LEFT breast asymmetry.

I have discussed the findings and recommendations with the patient.
If applicable, a reminder letter will be sent to the patient
regarding the next appointment.

BI-RADS CATEGORY  4: Suspicious.

## 2022-01-13 ENCOUNTER — Telehealth: Payer: Self-pay

## 2022-01-13 MED ORDER — NIFEDIPINE ER OSMOTIC RELEASE 30 MG PO TB24: 30.0000 mg | ORAL_TABLET | Freq: Every day | ORAL | 6 refills | Status: DC

## 2022-01-13 NOTE — Telephone Encounter (Signed)
Refill request approved for Nifedipine ER 30 mg tablets.

## 2022-03-09 DIAGNOSIS — Z01419 Encounter for gynecological examination (general) (routine) without abnormal findings: Secondary | ICD-10-CM | POA: Diagnosis not present

## 2022-03-09 DIAGNOSIS — Z6828 Body mass index (BMI) 28.0-28.9, adult: Secondary | ICD-10-CM | POA: Diagnosis not present

## 2022-03-09 LAB — HM PAP SMEAR: HM Pap smear: NEGATIVE

## 2022-07-25 ENCOUNTER — Other Ambulatory Visit: Payer: Self-pay | Admitting: Cardiology

## 2022-10-13 DIAGNOSIS — R509 Fever, unspecified: Secondary | ICD-10-CM | POA: Diagnosis not present

## 2022-10-13 DIAGNOSIS — J020 Streptococcal pharyngitis: Secondary | ICD-10-CM | POA: Diagnosis not present

## 2022-10-13 DIAGNOSIS — Z6827 Body mass index (BMI) 27.0-27.9, adult: Secondary | ICD-10-CM | POA: Diagnosis not present

## 2022-10-13 DIAGNOSIS — E663 Overweight: Secondary | ICD-10-CM | POA: Diagnosis not present

## 2022-10-19 ENCOUNTER — Other Ambulatory Visit: Payer: Self-pay | Admitting: Family Medicine

## 2022-10-19 DIAGNOSIS — Z1231 Encounter for screening mammogram for malignant neoplasm of breast: Secondary | ICD-10-CM

## 2022-10-26 ENCOUNTER — Telehealth: Payer: Self-pay | Admitting: Cardiology

## 2022-10-26 MED ORDER — METOPROLOL TARTRATE 25 MG PO TABS: ORAL_TABLET | ORAL | 3 refills | Status: DC

## 2022-10-26 NOTE — Telephone Encounter (Signed)
*  STAT* If patient is at the pharmacy, call can be transferred to refill team.   1. Which medications need to be refilled? (please list name of each medication and dose if known)  metoprolol tartrate (LOPRESSOR) 25 MG tablet   2. Which pharmacy/location (including street and city if local pharmacy) is medication to be sent to? [id number] [place] [place] - [address]  3. Do they need a 30 day or 90 day supply? 90 day  Patient is out of medication

## 2022-10-26 NOTE — Telephone Encounter (Signed)
Completed.

## 2022-12-04 ENCOUNTER — Ambulatory Visit
Admission: RE | Admit: 2022-12-04 | Discharge: 2022-12-04 | Disposition: A | Discharge status: Home / Self Care | Payer: Self-pay | Source: Ambulatory Visit | Attending: Family Medicine | Admitting: Family Medicine

## 2022-12-04 DIAGNOSIS — Z1231 Encounter for screening mammogram for malignant neoplasm of breast: Secondary | ICD-10-CM

## 2022-12-27 ENCOUNTER — Encounter: Payer: Self-pay | Admitting: Internal Medicine

## 2022-12-27 ENCOUNTER — Ambulatory Visit: Payer: BC Managed Care – PPO | Attending: Internal Medicine | Admitting: Internal Medicine

## 2022-12-27 VITALS — BP 140/92 | HR 73 | Ht 64.5 in | Wt 163.8 lb

## 2022-12-27 DIAGNOSIS — Z8249 Family history of ischemic heart disease and other diseases of the circulatory system: Secondary | ICD-10-CM

## 2022-12-27 DIAGNOSIS — I10 Essential (primary) hypertension: Secondary | ICD-10-CM | POA: Diagnosis not present

## 2022-12-27 NOTE — Progress Notes (Addendum)
Cardiology Office Note  Date: 12/27/2022   ID: Megan Pruitt, DOB June 28, 1971, MRN [id number]  PCP:  Assunta Found, MD  Cardiologist:  Marjo Bicker, MD Electrophysiologist:  None   Reason for Office Visit: Follow-up of palpitations, family history of premature CAD   History of Present Illness: Megan Pruitt is a 52 y.o. female known to have HTN, HLD, family history manage CAD is here for follow-up visit. EKG shows T wave inversions in inferior leads and NSR.  She was initially referred to cardiology clinic for palpitations for which she was started on metoprolol with resolution of the symptoms. She had a history of heart attack in her father at the age of 73.  Denies any angina, DOE, palpitations, syncope, leg swelling in the last 1 year.  Past Medical History:  Diagnosis Date   Hyperlipidemia    Hypertension     Past Surgical History:  Procedure Laterality Date   BREAST BIOPSY Left 04/23/2020   BREAST SURGERY     absess     Current Outpatient Medications  Medication Sig Dispense Refill   atorvastatin (LIPITOR) 40 MG tablet Take 40 mg by mouth daily.     metoprolol tartrate (LOPRESSOR) 25 MG tablet TAKE 0.5 TABLETS BY MOUTH 2 TIMES DAILY. 60 tablet 3   NIFEdipine (PROCARDIA-XL/NIFEDICAL-XL) 30 MG 24 hr tablet TAKE 1 TABLET BY MOUTH EVERY DAY 90 tablet 2   No current facility-administered medications for this visit.   Allergies:  Patient has no known allergies.   Social History: The patient  reports that she has never smoked. She has never used smokeless tobacco. She reports that she does not drink alcohol and does not use drugs.   Family History: The patient's family history includes Heart attack in her father; Hypertension in her brother, mother, and sister.   ROS:  Please see the history of present illness. Otherwise, complete review of systems is positive for none.  All other systems are reviewed and negative.   Physical Exam: VS:  BP (!) 140/92    Pulse 73   Ht 5' 4.5" (1.638 m)   Wt 163 lb 12.8 oz (74.3 kg)   SpO2 97%   BMI 27.68 kg/m , BMI Body mass index is 27.68 kg/m.  Wt Readings from Last 3 Encounters:  12/27/22 163 lb 12.8 oz (74.3 kg)  12/22/21 165 lb 6.4 oz (75 kg)  07/23/21 160 lb (72.6 kg)    General: Patient appears comfortable at rest. HEENT: Conjunctiva and lids normal, oropharynx clear with moist mucosa. Neck: Supple, no elevated JVP or carotid bruits, no thyromegaly. Lungs: Clear to auscultation, nonlabored breathing at rest. Cardiac: Regular rate and rhythm, no S3 or significant systolic murmur, no pericardial rub. Abdomen: Soft, nontender, no hepatomegaly, bowel sounds present, no guarding or rebound. Extremities: No pitting edema, distal pulses 2+. Skin: Warm and dry. Musculoskeletal: No kyphosis. Neuropsychiatric: Alert and oriented x3, affect grossly appropriate.  Recent Labwork: No results found for requested labs within last 365 days.  No results found for: "CHOL", "TRIG", "HDL", "CHOLHDL", "VLDL", "LDLCALC", "LDLDIRECT"  Other Studies Reviewed Today:   Assessment and Plan: Patient is a 52 year old F known to have HTN, HLD is here for follow-up visit of palpitations.  # Palpitations: Controlled, continue metoprolol tartrate 25 mg twice daily # HTN: Continue nifedipine 30 mg once daily.  Instructed patient to check her blood pressures in a.m. and p.m. If blood pressures at home are elevated, can reach out to PCP for antihypertensive medication management. #  HLD: Continue atorvastatin 40 mg nightly, goal LDL less than 100 # Family history of premature CAD: Obtain a CT calcium scoring of coronaries.  Continue atorvastatin 40 mg nightly.  EKG showed nonspecific T wave inversions in the inferior leads.  No angina or DOE.  Patient can return back to cardiology clinic as needed, management of current medical conditions including HTN, HLD per PCP.   I have spent a total of 30 minutes with patient  reviewing chart, EKGs, labs and examining patient as well as establishing an assessment and plan that was discussed with the patient.  > 50% of time was spent in direct patient care.    Medication Adjustments/Labs and Tests Ordered: Current medicines are reviewed at length with the patient today.  Concerns regarding medicines are outlined above.   Tests Ordered: Orders Placed This Encounter  Procedures   CT CARDIAC SCORING (SELF PAY ONLY)   EKG 12-Lead    Medication Changes: No orders of the defined types were placed in this encounter.   Disposition:  Follow up  pending results  Signed, Megan Kaps Verne Spurr, MD, 12/27/2022 4:18 PM    [facility] [address] [place], [place] [address]

## 2022-12-27 NOTE — Patient Instructions (Addendum)
Medication Instructions:  Your physician recommends that you continue on your current medications as directed. Please refer to the Current Medication list given to you today.  *If you need a refill on your cardiac medications before your next appointment, please call your pharmacy*   Lab Work: None If you have labs (blood work) drawn today and your tests are completely normal, you will receive your results only by: MyChart Message (if you have MyChart) OR A paper copy in the mail If you have any lab test that is abnormal or we need to change your treatment, we will call you to review the results.   Testing/Procedures: Coronary Calcium CT   Follow-Up: At [facility], you and your health needs are our priority.  As part of our continuing mission to provide you with exceptional heart care, we have created designated Provider Care Teams.  These Care Teams include your primary Cardiologist (physician) and Advanced Practice Providers (APPs -  Physician Assistants and Nurse Practitioners) who all work together to provide you with the care you need, when you need it.  We recommend signing up for the patient portal called "MyChart".  Sign up information is provided on this After Visit Summary.  MyChart is used to connect with patients for Virtual Visits (Telemedicine).  Patients are able to view lab/test results, encounter notes, upcoming appointments, etc.  Non-urgent messages can be sent to your provider as well.   To learn more about what you can do with MyChart, go to [email or web address].    Your next appointment:   Follow up pending testing results  Provider:   Luane School, MD    Other Instructions

## 2023-02-22 ENCOUNTER — Ambulatory Visit (HOSPITAL_COMMUNITY)
Admission: RE | Admit: 2023-02-22 | Discharge: 2023-02-22 | Disposition: A | Discharge status: Home / Self Care | Payer: Self-pay | Source: Ambulatory Visit | Attending: Internal Medicine | Admitting: Internal Medicine

## 2023-02-22 DIAGNOSIS — Z8249 Family history of ischemic heart disease and other diseases of the circulatory system: Secondary | ICD-10-CM | POA: Insufficient documentation

## 2023-04-11 ENCOUNTER — Encounter: Payer: Self-pay | Admitting: Internal Medicine

## 2023-04-11 ENCOUNTER — Ambulatory Visit: Payer: BC Managed Care – PPO | Admitting: Internal Medicine

## 2023-04-11 VITALS — BP 135/89 | HR 100 | Ht 64.0 in | Wt 165.4 lb

## 2023-04-11 DIAGNOSIS — K219 Gastro-esophageal reflux disease without esophagitis: Secondary | ICD-10-CM | POA: Diagnosis not present

## 2023-04-11 DIAGNOSIS — I10 Essential (primary) hypertension: Secondary | ICD-10-CM | POA: Diagnosis not present

## 2023-04-11 DIAGNOSIS — Z131 Encounter for screening for diabetes mellitus: Secondary | ICD-10-CM

## 2023-04-11 DIAGNOSIS — Z1321 Encounter for screening for nutritional disorder: Secondary | ICD-10-CM

## 2023-04-11 DIAGNOSIS — Z114 Encounter for screening for human immunodeficiency virus [HIV]: Secondary | ICD-10-CM | POA: Diagnosis not present

## 2023-04-11 DIAGNOSIS — M25561 Pain in right knee: Secondary | ICD-10-CM

## 2023-04-11 DIAGNOSIS — R002 Palpitations: Secondary | ICD-10-CM

## 2023-04-11 DIAGNOSIS — Z1211 Encounter for screening for malignant neoplasm of colon: Secondary | ICD-10-CM

## 2023-04-11 DIAGNOSIS — E7800 Pure hypercholesterolemia, unspecified: Secondary | ICD-10-CM

## 2023-04-11 DIAGNOSIS — Z1159 Encounter for screening for other viral diseases: Secondary | ICD-10-CM

## 2023-04-11 DIAGNOSIS — M25562 Pain in left knee: Secondary | ICD-10-CM

## 2023-04-11 DIAGNOSIS — Z0001 Encounter for general adult medical examination with abnormal findings: Secondary | ICD-10-CM | POA: Insufficient documentation

## 2023-04-11 DIAGNOSIS — Z1283 Encounter for screening for malignant neoplasm of skin: Secondary | ICD-10-CM

## 2023-04-11 DIAGNOSIS — Z1329 Encounter for screening for other suspected endocrine disorder: Secondary | ICD-10-CM | POA: Diagnosis not present

## 2023-04-11 DIAGNOSIS — G8929 Other chronic pain: Secondary | ICD-10-CM

## 2023-04-11 DIAGNOSIS — M25552 Pain in left hip: Secondary | ICD-10-CM

## 2023-04-11 DIAGNOSIS — M25551 Pain in right hip: Secondary | ICD-10-CM

## 2023-04-11 DIAGNOSIS — Z8249 Family history of ischemic heart disease and other diseases of the circulatory system: Secondary | ICD-10-CM

## 2023-04-11 HISTORY — DX: Encounter for screening for malignant neoplasm of colon: Z12.11

## 2023-04-11 NOTE — Assessment & Plan Note (Signed)
Cologuard ordered today °

## 2023-04-11 NOTE — Progress Notes (Signed)
New Patient Office Visit  Subjective    Patient ID: Megan Pruitt, female    DOB: 09-08-1970  Age: 52 y.o. MRN: [id number]  CC:  Chief Complaint  Patient presents with   Establish Care    HPI Megan Pruitt presents to establish care.  She is a 52 year old woman who endorses a past medical history significant for HTN, HLD, and heart palpitations.  Previously followed at [facility].  Megan Pruitt reports feeling well today.  She endorses chronic joint pain and reflux symptoms.  She does not have any acute concerns to discuss today aside from desiring to establish care.  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  She denies tobacco, alcohol, and illicit drug use.  Her family medical history is significant for early CAD, HTN, and CHF.  Chronic medical conditions and outstanding preventative care items discussed today are individually addressed A/P below.   Outpatient Encounter Medications as of 04/11/2023  Medication Sig   atorvastatin (LIPITOR) 40 MG tablet Take 40 mg by mouth daily.   metoprolol tartrate (LOPRESSOR) 25 MG tablet TAKE 0.5 TABLETS BY MOUTH 2 TIMES DAILY.   NIFEdipine (PROCARDIA-XL/NIFEDICAL-XL) 30 MG 24 hr tablet TAKE 1 TABLET BY MOUTH EVERY DAY   No facility-administered encounter medications on file as of 04/11/2023.    Past Medical History:  Diagnosis Date   Anemia    Colon cancer screening 04/11/2023   GERD (gastroesophageal reflux disease)    Hyperlipidemia    Hypertension     Past Surgical History:  Procedure Laterality Date   BREAST BIOPSY Left 04/23/2020   BREAST SURGERY     absess     Family History  Problem Relation Age of Onset   Hypertension Mother    Heart attack Father    Hypertension Father    Hypertension Sister    Hypertension Brother    ADD / ADHD Son     Social History   Socioeconomic History   Marital status: Married    Spouse name: Not on file   Number of children: Not on file   Years of  education: Not on file   Highest education level: Not on file  Occupational History   Not on file  Tobacco Use   Smoking status: Never   Smokeless tobacco: Never  Vaping Use   Vaping status: Never Used  Substance and Sexual Activity   Alcohol use: No   Drug use: No   Sexual activity: Yes    Birth control/protection: Post-menopausal, None  Other Topics Concern   Not on file  Social History Narrative   Not on file   Social Determinants of Health   Financial Resource Strain: Not on file  Food Insecurity: Not on file  Transportation Needs: Not on file  Physical Activity: Not on file  Stress: Not on file  Social Connections: Not on file  Intimate Partner Violence: Not At Risk (03/09/2022)   Received from [facility]   Humiliation, Afraid, Rape, and Kick questionnaire    Fear of Current or Ex-Partner: No    Emotionally Abused: No    Physically Abused: No    Sexually Abused: No   Review of Systems  Constitutional:  Negative for chills and fever.  HENT:  Negative for sore throat.   Respiratory:  Negative for cough and shortness of breath.   Cardiovascular:  Negative for chest pain, palpitations and leg swelling.  Gastrointestinal:  Positive for heartburn. Negative for abdominal pain,  blood in stool, constipation, diarrhea, nausea and vomiting.  Genitourinary:  Negative for dysuria and hematuria.  Musculoskeletal:  Positive for joint pain. Negative for myalgias.  Skin:  Negative for itching and rash.  Neurological:  Negative for dizziness and headaches.  Psychiatric/Behavioral:  Negative for depression and suicidal ideas.    Objective    BP 135/89   Pulse 100   Ht 5\' 4"  (1.626 m)   Wt 165 lb 6.4 oz (75 kg)   SpO2 98%   BMI 28.39 kg/m   Physical Exam Vitals reviewed.  Constitutional:      General: She is not in acute distress.    Appearance: Normal appearance. She is not toxic-appearing.  HENT:     Head: Normocephalic and atraumatic.     Right  Ear: External ear normal.     Left Ear: External ear normal.     Nose: Nose normal. No congestion or rhinorrhea.     Mouth/Throat:     Mouth: Mucous membranes are moist.     Pharynx: Oropharynx is clear. No oropharyngeal exudate or posterior oropharyngeal erythema.  Eyes:     General: No scleral icterus.    Extraocular Movements: Extraocular movements intact.     Conjunctiva/sclera: Conjunctivae normal.     Pupils: Pupils are equal, round, and reactive to light.  Cardiovascular:     Rate and Rhythm: Normal rate and regular rhythm.     Pulses: Normal pulses.     Heart sounds: Normal heart sounds. No murmur heard.    No friction rub. No gallop.  Pulmonary:     Effort: Pulmonary effort is normal.     Breath sounds: Normal breath sounds. No wheezing, rhonchi or rales.  Abdominal:     General: Abdomen is flat. Bowel sounds are normal. There is no distension.     Palpations: Abdomen is soft.     Tenderness: There is no abdominal tenderness.  Musculoskeletal:        General: No swelling. Normal range of motion.     Cervical back: Normal range of motion.     Right lower leg: No edema.     Left lower leg: No edema.  Lymphadenopathy:     Cervical: No cervical adenopathy.  Skin:    General: Skin is warm and dry.     Capillary Refill: Capillary refill takes less than 2 seconds.     Coloration: Skin is not jaundiced.  Neurological:     General: No focal deficit present.     Mental Status: She is alert and oriented to person, place, and time.  Psychiatric:        Mood and Affect: Mood normal.        Behavior: Behavior normal.    Assessment & Plan:   Problem List Items Addressed This Visit       HTN, goal below 130/80 (Chronic)    Previously followed by cardiology.  She is currently prescribed Procardia XL 30 mg daily and metoprolol to tartrate 12.5 mg twice daily.  BP today is 135/89.  No medication changes are indicated.      Esophageal reflux    She endorses chronic symptoms  of reflux.  Currently managing with OTC antacid medications.      Palpitations (Chronic)    Previously followed by cardiology.  Currently prescribed metoprolol tartrate and nifedipine.  Denies recent palpitations.      Family history of early CAD (Chronic)    Currently prescribed atorvastatin 40 mg daily.  Her father died  of an MI at age 32.  She recently underwent CT coronary calcium with score 0.      Pure hypercholesterolemia (Chronic)    She is currently prescribed atorvastatin 40 mg daily.  Repeat lipid panel ordered today.      Colon cancer screening    Cologuard ordered today      Chronic arthralgias of knees and hips    She endorses a chronic history of arthralgias.  Symptoms have improved since reducing gluten intake and following an anti-inflammatory diet.      Skin exam, screening for cancer    Dermatology referral placed today at patient's request      Return in about 1 year (around 04/10/2024) for CPE.   Billie Lade, MD

## 2023-04-11 NOTE — Assessment & Plan Note (Signed)
She is currently prescribed atorvastatin 40 mg daily.  Repeat lipid panel ordered today. 

## 2023-04-11 NOTE — Assessment & Plan Note (Signed)
Currently prescribed atorvastatin 40 mg daily.  Her father died of an MI at age 51.  She recently underwent CT coronary calcium with score 0.

## 2023-04-11 NOTE — Assessment & Plan Note (Signed)
She endorses a chronic history of arthralgias.  Symptoms have improved since reducing gluten intake and following an anti-inflammatory diet.

## 2023-04-11 NOTE — Assessment & Plan Note (Signed)
Previously followed by cardiology.  She is currently prescribed Procardia XL 30 mg daily and metoprolol to tartrate 12.5 mg twice daily.  BP today is 135/89.  No medication changes are indicated.

## 2023-04-11 NOTE — Assessment & Plan Note (Signed)
She endorses chronic symptoms of reflux.  Currently managing with OTC antacid medications.

## 2023-04-11 NOTE — Assessment & Plan Note (Signed)
Previously followed by cardiology.  Currently prescribed metoprolol tartrate and nifedipine.  Denies recent palpitations.

## 2023-04-11 NOTE — Patient Instructions (Signed)
It was a pleasure to see you today.  Thank you for giving [place] the opportunity to be involved in your care.  Below is a brief recap of your visit and next steps.  We will plan to see you again in 1 year.  Summary You have established care today We will check basic labs Cologuard has been ordered Dermatology referral placed We will follow up in 1 year for annual exam

## 2023-04-11 NOTE — Assessment & Plan Note (Signed)
Dermatology referral placed today at patient's request

## 2023-04-12 ENCOUNTER — Other Ambulatory Visit: Payer: Self-pay | Admitting: Internal Medicine

## 2023-04-12 DIAGNOSIS — R7989 Other specified abnormal findings of blood chemistry: Secondary | ICD-10-CM

## 2023-04-12 DIAGNOSIS — R748 Abnormal levels of other serum enzymes: Secondary | ICD-10-CM

## 2023-04-13 LAB — CBC WITH DIFFERENTIAL/PLATELET
Basophils Absolute: 0.1 10*3/uL (ref 0.0–0.2)
Basos: 1 %
EOS (ABSOLUTE): 0.2 10*3/uL (ref 0.0–0.4)
Eos: 3 %
Hematocrit: 37.1 % (ref 34.0–46.6)
Hemoglobin: 12.7 g/dL (ref 11.1–15.9)
Immature Grans (Abs): 0 10*3/uL (ref 0.0–0.1)
Immature Granulocytes: 0 %
Lymphocytes Absolute: 2.7 10*3/uL (ref 0.7–3.1)
Lymphs: 40 %
MCH: 27.7 pg (ref 26.6–33.0)
MCHC: 34.2 g/dL (ref 31.5–35.7)
MCV: 81 fL (ref 79–97)
Monocytes Absolute: 0.5 10*3/uL (ref 0.1–0.9)
Monocytes: 7 %
Neutrophils Absolute: 3.3 10*3/uL (ref 1.4–7.0)
Neutrophils: 49 %
Platelets: 351 10*3/uL (ref 150–450)
RBC: 4.58 x10E6/uL (ref 3.77–5.28)
RDW: 13 % (ref 11.7–15.4)
WBC: 6.6 10*3/uL (ref 3.4–10.8)

## 2023-04-13 LAB — LIPID PANEL
Chol/HDL Ratio: 3.3 ratio (ref 0.0–4.4)
Cholesterol, Total: 160 mg/dL (ref 100–199)
HDL: 48 mg/dL (ref >39)
LDL Chol Calc (NIH): 90 mg/dL (ref 0–99)
Triglycerides: 126 mg/dL (ref 0–149)
VLDL Cholesterol Cal: 22 mg/dL (ref 5–40)

## 2023-04-13 LAB — B12 AND FOLATE PANEL
Folate: 6.9 ng/mL (ref >3.0)
Vitamin B-12: 831 pg/mL (ref 232–1245)

## 2023-04-13 LAB — CMP14+EGFR
ALT: 26 IU/L (ref 0–32)
AST: 24 IU/L (ref 0–40)
Albumin: 4.7 g/dL (ref 3.9–4.9)
Alkaline Phosphatase: 141 IU/L — ABNORMAL HIGH (ref 44–121)
BUN/Creatinine Ratio: 12 (ref 9–23)
BUN: 12 mg/dL (ref 6–24)
Bilirubin Total: 1 mg/dL (ref 0.0–1.2)
CO2: 25 mmol/L (ref 20–29)
Calcium: 9.9 mg/dL (ref 8.7–10.2)
Chloride: 102 mmol/L (ref 96–106)
Creatinine, Ser: 1.02 mg/dL — ABNORMAL HIGH (ref 0.57–1.00)
Globulin, Total: 2.8 g/dL (ref 1.5–4.5)
Glucose: 96 mg/dL (ref 70–99)
Potassium: 4.2 mmol/L (ref 3.5–5.2)
Sodium: 141 mmol/L (ref 134–144)
Total Protein: 7.5 g/dL (ref 6.0–8.5)
eGFR: 67 mL/min/{1.73_m2} (ref >59)

## 2023-04-13 LAB — HCV AB W REFLEX TO QUANT PCR: HCV Ab: NONREACTIVE

## 2023-04-13 LAB — HIV ANTIBODY (ROUTINE TESTING W REFLEX): HIV Screen 4th Generation wRfx: NONREACTIVE

## 2023-04-13 LAB — TSH+FREE T4
Free T4: 1.32 ng/dL (ref 0.82–1.77)
TSH: 1.98 u[IU]/mL (ref 0.450–4.500)

## 2023-04-13 LAB — VITAMIN D 25 HYDROXY (VIT D DEFICIENCY, FRACTURES): Vit D, 25-Hydroxy: 40 ng/mL (ref 30.0–100.0)

## 2023-04-13 LAB — HEMOGLOBIN A1C
Est. average glucose Bld gHb Est-mCnc: 120 mg/dL
Hgb A1c MFr Bld: 5.8 % — ABNORMAL HIGH (ref 4.8–5.6)

## 2023-04-13 LAB — HCV INTERPRETATION

## 2023-04-20 DIAGNOSIS — Z1211 Encounter for screening for malignant neoplasm of colon: Secondary | ICD-10-CM | POA: Diagnosis not present

## 2023-04-22 ENCOUNTER — Other Ambulatory Visit: Payer: Self-pay | Admitting: Cardiology

## 2023-04-26 LAB — COLOGUARD: COLOGUARD: NEGATIVE

## 2023-04-27 ENCOUNTER — Other Ambulatory Visit: Payer: Self-pay

## 2023-04-27 ENCOUNTER — Encounter: Payer: Self-pay | Admitting: Internal Medicine

## 2023-04-27 MED ORDER — ATORVASTATIN CALCIUM 40 MG PO TABS: 40.0000 mg | ORAL_TABLET | Freq: Every day | ORAL | 1 refills | Status: DC

## 2023-05-14 DIAGNOSIS — E663 Overweight: Secondary | ICD-10-CM | POA: Diagnosis not present

## 2023-05-14 DIAGNOSIS — R030 Elevated blood-pressure reading, without diagnosis of hypertension: Secondary | ICD-10-CM | POA: Diagnosis not present

## 2023-05-14 DIAGNOSIS — U071 COVID-19: Secondary | ICD-10-CM | POA: Diagnosis not present

## 2023-05-14 DIAGNOSIS — Z6827 Body mass index (BMI) 27.0-27.9, adult: Secondary | ICD-10-CM | POA: Diagnosis not present

## 2023-05-19 ENCOUNTER — Other Ambulatory Visit: Payer: Self-pay | Admitting: Internal Medicine

## 2023-05-25 ENCOUNTER — Encounter: Payer: Self-pay | Admitting: Internal Medicine

## 2023-06-21 DIAGNOSIS — Z1283 Encounter for screening for malignant neoplasm of skin: Secondary | ICD-10-CM | POA: Diagnosis not present

## 2023-06-21 DIAGNOSIS — R7989 Other specified abnormal findings of blood chemistry: Secondary | ICD-10-CM | POA: Diagnosis not present

## 2023-06-21 DIAGNOSIS — R748 Abnormal levels of other serum enzymes: Secondary | ICD-10-CM | POA: Diagnosis not present

## 2023-06-21 DIAGNOSIS — D225 Melanocytic nevi of trunk: Secondary | ICD-10-CM | POA: Diagnosis not present

## 2023-06-22 LAB — CMP14+EGFR
ALT: 23 [IU]/L (ref 0–32)
AST: 22 [IU]/L (ref 0–40)
Albumin: 4.6 g/dL (ref 3.8–4.9)
Alkaline Phosphatase: 128 [IU]/L — ABNORMAL HIGH (ref 44–121)
BUN/Creatinine Ratio: 15 (ref 9–23)
BUN: 15 mg/dL (ref 6–24)
Bilirubin Total: 0.8 mg/dL (ref 0.0–1.2)
CO2: 21 mmol/L (ref 20–29)
Calcium: 9.6 mg/dL (ref 8.7–10.2)
Chloride: 104 mmol/L (ref 96–106)
Creatinine, Ser: 0.99 mg/dL (ref 0.57–1.00)
Globulin, Total: 2.5 g/dL (ref 1.5–4.5)
Glucose: 91 mg/dL (ref 70–99)
Potassium: 3.9 mmol/L (ref 3.5–5.2)
Sodium: 142 mmol/L (ref 134–144)
Total Protein: 7.1 g/dL (ref 6.0–8.5)
eGFR: 69 mL/min/{1.73_m2} (ref >59)

## 2023-06-22 LAB — GAMMA GT: GGT: 18 [IU]/L (ref 0–60)

## 2023-11-25 ENCOUNTER — Other Ambulatory Visit: Payer: Self-pay | Admitting: Internal Medicine

## 2024-01-20 ENCOUNTER — Other Ambulatory Visit: Payer: Self-pay | Admitting: Cardiology

## 2024-02-19 ENCOUNTER — Other Ambulatory Visit: Payer: Self-pay | Admitting: Internal Medicine

## 2024-02-19 DIAGNOSIS — Z1231 Encounter for screening mammogram for malignant neoplasm of breast: Secondary | ICD-10-CM

## 2024-02-22 ENCOUNTER — Encounter: Payer: Self-pay | Admitting: Internal Medicine

## 2024-02-25 ENCOUNTER — Other Ambulatory Visit: Payer: Self-pay

## 2024-02-25 MED ORDER — METOPROLOL TARTRATE 25 MG PO TABS: ORAL_TABLET | ORAL | 2 refills | Status: AC

## 2024-02-27 ENCOUNTER — Other Ambulatory Visit: Payer: Self-pay | Admitting: *Deleted

## 2024-02-27 ENCOUNTER — Ambulatory Visit
Admission: RE | Admit: 2024-02-27 | Discharge: 2024-02-27 | Disposition: A | Discharge status: Home / Self Care | Payer: Self-pay | Source: Ambulatory Visit | Attending: Internal Medicine | Admitting: Internal Medicine

## 2024-02-27 DIAGNOSIS — Z1231 Encounter for screening mammogram for malignant neoplasm of breast: Secondary | ICD-10-CM

## 2024-02-27 MED ORDER — NIFEDIPINE ER OSMOTIC RELEASE 30 MG PO TB24: 30.0000 mg | ORAL_TABLET | Freq: Every day | ORAL | 0 refills | Status: DC

## 2024-03-09 ENCOUNTER — Other Ambulatory Visit: Payer: Self-pay | Admitting: Cardiology

## 2024-03-11 ENCOUNTER — Other Ambulatory Visit: Payer: Self-pay | Admitting: Cardiology

## 2024-03-13 ENCOUNTER — Other Ambulatory Visit: Payer: Self-pay

## 2024-03-13 ENCOUNTER — Encounter: Payer: Self-pay | Admitting: Internal Medicine

## 2024-03-13 MED ORDER — NIFEDIPINE ER OSMOTIC RELEASE 30 MG PO TB24: 30.0000 mg | ORAL_TABLET | Freq: Every day | ORAL | 3 refills | Status: AC

## 2024-03-13 NOTE — Telephone Encounter (Signed)
 Copied from CRM [id number]. Topic: Clinical - Medication Refill >> Mar 13, 2024  1:54 PM Sophia H wrote: Medication: NIFEdipine  (PROCARDIA -XL/NIFEDICAL-XL) 30 MG 24 hr tablet  Has the patient contacted their pharmacy? Yes, need new rx from new provider   This is the patient's preferred pharmacy:  [id number] - [place], [place] - [address] [place] [address] Phone: [phone] Fax: [phone]  Is this the correct pharmacy for this prescription? Yes If no, delete pharmacy and type the correct one.   Has the prescription been filled recently? Yes  Is the patient out of the medication? No, has 3 days left and will be going out of town.   Has the patient been seen for an appointment in the last year OR does the patient have an upcoming appointment? Yes, appt to re establish care 10/01. Patient is aware may not be able to get meds filled but requested I put in refill request on her behalf   Can we respond through MyChart? Yes  Agent: Please be advised that Rx refills may take up to 3 business days. We ask that you follow-up with your pharmacy.

## 2024-04-14 ENCOUNTER — Encounter: Payer: BC Managed Care – PPO | Admitting: Internal Medicine

## 2024-05-27 ENCOUNTER — Other Ambulatory Visit: Payer: Self-pay | Admitting: Internal Medicine

## 2024-09-05 ENCOUNTER — Ambulatory Visit (INDEPENDENT_AMBULATORY_CARE_PROVIDER_SITE_OTHER): Payer: Self-pay

## 2024-09-05 VITALS — BP 136/88 | HR 79 | Ht 64.0 in | Wt 165.0 lb

## 2024-09-05 DIAGNOSIS — M25551 Pain in right hip: Secondary | ICD-10-CM

## 2024-09-05 DIAGNOSIS — G8929 Other chronic pain: Secondary | ICD-10-CM | POA: Diagnosis not present

## 2024-09-05 DIAGNOSIS — M25561 Pain in right knee: Secondary | ICD-10-CM | POA: Diagnosis not present

## 2024-09-05 DIAGNOSIS — M25562 Pain in left knee: Secondary | ICD-10-CM

## 2024-09-05 DIAGNOSIS — Z0001 Encounter for general adult medical examination with abnormal findings: Secondary | ICD-10-CM | POA: Diagnosis not present

## 2024-09-05 DIAGNOSIS — M25552 Pain in left hip: Secondary | ICD-10-CM | POA: Diagnosis not present

## 2024-09-05 NOTE — Progress Notes (Unsigned)
" ° °  Complete physical exam  Patient: Megan Pruitt   DOB: 09/23/1970   54 y.o. Female  MRN: [id number]  Subjective:    Chief Complaint  Patient presents with   Annual Exam    Megan Pruitt is a 54 y.o. female who presents today for a complete physical exam. She reports consuming a {diet types:17450} diet. {types:19826} She generally feels {DESC; WELL/FAIRLY WELL/POORLY:18703}. She reports sleeping {DESC; WELL/FAIRLY WELL/POORLY:18703}. She {does/does not:200015} have additional problems to discuss today.    Most recent fall risk assessment:    04/11/2023    4:29 PM  Fall Risk   Falls in the past year? 0  Number falls in past yr: 0  Injury with Fall? 0   Risk for fall due to : No Fall Risks  Follow up Falls evaluation completed     Data saved with a previous flowsheet row definition     Most recent depression screenings:    04/11/2023    4:29 PM  PHQ 2/9 Scores  PHQ - 2 Score 0  PHQ- 9 Score 0      Data saved with a previous flowsheet row definition    {VISON DENTAL STD PSA (Optional):27386}  {History (Optional):23778}  Patient Care Team: Bevely Doffing, FNP as PCP - General (Family Medicine) Mallipeddi, Diannah SQUIBB, MD as PCP - Cardiology (Cardiology)   Show/hide medication list[1]  ROS        Objective:     BP (!) 149/92   Pulse 79   Ht 5' 4 (1.626 m)   Wt 165 lb (74.8 kg)   SpO2 96%   BMI 28.32 kg/m  {Vitals History (Optional):23777}  Physical Exam   No results found for any visits on 09/05/24. {Show previous labs (optional):23779}    Assessment & Plan:    Routine Health Maintenance and Physical Exam  Immunization History  Administered Date(s) Administered   Tdap 08/12/2009, 03/15/2013    Health Maintenance  Topic Date Due   Hepatitis B Vaccines 19-59 Average Risk (1 of 3 - 19+ 3-dose series) Never done   Colonoscopy  Never done   Pneumococcal Vaccine: 50+ Years (1 of 1 - PCV) Never done   Zoster Vaccines- Shingrix (1 of 2)  Never done   DTaP/Tdap/Td (3 - Td or Tdap) 03/16/2023   Influenza Vaccine  Never done   COVID-19 Vaccine (1 - 2025-26 season) Never done   Cervical Cancer Screening (HPV/Pap Cotest)  03/09/2025   Mammogram  02/26/2026   Hepatitis C Screening  Completed   HIV Screening  Completed   HPV VACCINES  Aged Out   Meningococcal B Vaccine  Aged Out    Discussed health benefits of physical activity, and encouraged her to engage in regular exercise appropriate for her age and condition.  Problem List Items Addressed This Visit   None  No follow-ups on file.     Doffing Bevely, FNP      [1]  Outpatient Medications Prior to Visit  Medication Sig   atorvastatin  (LIPITOR) 40 MG tablet TAKE 1 TABLET BY MOUTH EVERY DAY   metoprolol  tartrate (LOPRESSOR ) 25 MG tablet TAKE 1/2 TABLET TWICE A DAY BY MOUTH   NIFEdipine  (PROCARDIA -XL/NIFEDICAL-XL) 30 MG 24 hr tablet Take 1 tablet (30 mg total) by mouth daily.   No facility-administered medications prior to visit.   "

## 2024-09-07 NOTE — Assessment & Plan Note (Signed)
 General Health Maintenance Routine health maintenance discussed. To return for fasting labs next week.

## 2024-09-07 NOTE — Assessment & Plan Note (Signed)
 Chronic joint pain with intermittent flare-ups. Differential includes rheumatoid arthritis and vitamin D  deficiency. Previous rheumatoid factor normal. Symptoms improved with gluten-free diet but recurred. Reports welts on feet without insect exposure.

## 2024-09-17 LAB — CMP14+EGFR
ALT: 21 IU/L (ref 0–32)
AST: 19 IU/L (ref 0–40)
Albumin: 4.7 g/dL (ref 3.8–4.9)
Alkaline Phosphatase: 123 IU/L (ref 49–135)
BUN/Creatinine Ratio: 19 (ref 9–23)
BUN: 18 mg/dL (ref 6–24)
Bilirubin Total: 0.7 mg/dL (ref 0.0–1.2)
CO2: 21 mmol/L (ref 20–29)
Calcium: 10 mg/dL (ref 8.7–10.2)
Chloride: 104 mmol/L (ref 96–106)
Creatinine, Ser: 0.97 mg/dL (ref 0.57–1.00)
Globulin, Total: 2.8 g/dL (ref 1.5–4.5)
Glucose: 88 mg/dL (ref 70–99)
Potassium: 4.3 mmol/L (ref 3.5–5.2)
Sodium: 141 mmol/L (ref 134–144)
Total Protein: 7.5 g/dL (ref 6.0–8.5)
eGFR: 70 mL/min/1.73

## 2024-09-17 LAB — RHEUMATOID FACTOR: Rheumatoid fact SerPl-aCnc: <10 [IU]/mL

## 2024-09-17 LAB — TSH+FREE T4
Free T4: 1.33 ng/dL (ref 0.82–1.77)
TSH: 2.5 u[IU]/mL (ref 0.450–4.500)

## 2024-09-17 LAB — CBC
Hematocrit: 42.4 % (ref 34.0–46.6)
Hemoglobin: 13.9 g/dL (ref 11.1–15.9)
MCH: 27.8 pg (ref 26.6–33.0)
MCHC: 32.8 g/dL (ref 31.5–35.7)
MCV: 85 fL (ref 79–97)
Platelets: 338 x10E3/uL (ref 150–450)
RBC: 5 x10E6/uL (ref 3.77–5.28)
RDW: 12.9 % (ref 11.7–15.4)
WBC: 6.4 x10E3/uL (ref 3.4–10.8)

## 2024-09-17 LAB — LIPID PANEL
Chol/HDL Ratio: 3.3 ratio (ref 0.0–4.4)
Cholesterol, Total: 152 mg/dL (ref 100–199)
HDL: 46 mg/dL
LDL Chol Calc (NIH): 90 mg/dL (ref 0–99)
Triglycerides: 81 mg/dL (ref 0–149)
VLDL Cholesterol Cal: 16 mg/dL (ref 5–40)

## 2024-09-17 LAB — ANA W/REFLEX

## 2024-09-17 LAB — VITAMIN D 25 HYDROXY (VIT D DEFICIENCY, FRACTURES): Vit D, 25-Hydroxy: 23.5 ng/mL — ABNORMAL LOW (ref 30.0–100.0)

## 2024-09-29 ENCOUNTER — Ambulatory Visit: Payer: Self-pay
